# Patient Record
Sex: Male | Born: 1975 | Race: White | Hispanic: No | Smoking: Current every day smoker
Health system: Southern US, Community
[De-identification: ages and names within clinical notes are randomized; demographics above are authoritative.]

## PROBLEM LIST (undated history)

## (undated) DIAGNOSIS — E785 Hyperlipidemia, unspecified: Secondary | ICD-10-CM

## (undated) DIAGNOSIS — F524 Premature ejaculation: Secondary | ICD-10-CM

## (undated) DIAGNOSIS — Z720 Tobacco use: Secondary | ICD-10-CM

## (undated) DIAGNOSIS — G4733 Obstructive sleep apnea (adult) (pediatric): Secondary | ICD-10-CM

## (undated) DIAGNOSIS — K219 Gastro-esophageal reflux disease without esophagitis: Secondary | ICD-10-CM

## (undated) DIAGNOSIS — E669 Obesity, unspecified: Secondary | ICD-10-CM

## (undated) HISTORY — DX: Hyperlipidemia, unspecified: E78.5

## (undated) HISTORY — DX: Premature ejaculation: F52.4

## (undated) HISTORY — PX: TONSILLECTOMY: SUR1361

## (undated) HISTORY — DX: Obstructive sleep apnea (adult) (pediatric): G47.33

## (undated) HISTORY — DX: Tobacco use: Z72.0

## (undated) HISTORY — DX: Obesity, unspecified: E66.9

## (undated) HISTORY — DX: Gastro-esophageal reflux disease without esophagitis: K21.9

---

## 2008-09-19 ENCOUNTER — Ambulatory Visit: Payer: Self-pay | Admitting: Family Medicine

## 2008-09-19 DIAGNOSIS — E669 Obesity, unspecified: Secondary | ICD-10-CM

## 2008-09-19 DIAGNOSIS — F17200 Nicotine dependence, unspecified, uncomplicated: Secondary | ICD-10-CM

## 2008-10-02 ENCOUNTER — Ambulatory Visit: Payer: Self-pay | Admitting: Family Medicine

## 2008-10-02 DIAGNOSIS — R5383 Other fatigue: Secondary | ICD-10-CM

## 2008-10-02 DIAGNOSIS — R5381 Other malaise: Secondary | ICD-10-CM

## 2008-10-06 ENCOUNTER — Encounter (INDEPENDENT_AMBULATORY_CARE_PROVIDER_SITE_OTHER): Payer: Self-pay | Admitting: *Deleted

## 2008-10-06 LAB — CONVERTED CEMR LAB
BUN: 15 mg/dL (ref 6–23)
Basophils Relative: 0.4 % (ref 0.0–3.0)
Cholesterol: 218 mg/dL — ABNORMAL HIGH (ref 0–200)
Creatinine, Ser: 1.3 mg/dL (ref 0.4–1.5)
Eosinophils Relative: 6.4 % — ABNORMAL HIGH (ref 0.0–5.0)
GFR calc non Af Amer: 68.24 mL/min (ref >60)
HCT: 45 % (ref 39.0–52.0)
HDL: 31.7 mg/dL — ABNORMAL LOW (ref >39.00)
Hemoglobin: 15.9 g/dL (ref 13.0–17.0)
Lymphs Abs: 2.4 10*3/uL (ref 0.7–4.0)
MCV: 93.3 fL (ref 78.0–100.0)
Monocytes Absolute: 0.6 10*3/uL (ref 0.1–1.0)
Neutro Abs: 4.3 10*3/uL (ref 1.4–7.7)
RBC: 4.82 M/uL (ref 4.22–5.81)
Total Bilirubin: 1 mg/dL (ref 0.3–1.2)
Total CHOL/HDL Ratio: 7
VLDL: 56.8 mg/dL — ABNORMAL HIGH (ref 0.0–40.0)
WBC: 7.8 10*3/uL (ref 4.5–10.5)

## 2008-10-14 ENCOUNTER — Ambulatory Visit: Payer: Self-pay | Admitting: Cardiovascular Disease

## 2008-10-14 ENCOUNTER — Encounter: Payer: Self-pay | Admitting: Cardiovascular Disease

## 2009-02-26 ENCOUNTER — Ambulatory Visit: Payer: Self-pay | Admitting: Family Medicine

## 2009-02-26 DIAGNOSIS — J209 Acute bronchitis, unspecified: Secondary | ICD-10-CM

## 2009-03-09 ENCOUNTER — Ambulatory Visit: Payer: Self-pay | Admitting: Family Medicine

## 2009-03-09 DIAGNOSIS — R05 Cough: Secondary | ICD-10-CM

## 2009-03-09 DIAGNOSIS — R059 Cough, unspecified: Secondary | ICD-10-CM | POA: Insufficient documentation

## 2009-04-15 ENCOUNTER — Ambulatory Visit: Payer: Self-pay | Admitting: Family Medicine

## 2009-06-23 ENCOUNTER — Ambulatory Visit: Payer: Self-pay | Admitting: Family Medicine

## 2009-06-23 DIAGNOSIS — F524 Premature ejaculation: Secondary | ICD-10-CM

## 2009-10-08 ENCOUNTER — Ambulatory Visit: Payer: Self-pay | Admitting: Family Medicine

## 2009-10-08 DIAGNOSIS — L723 Sebaceous cyst: Secondary | ICD-10-CM | POA: Insufficient documentation

## 2009-10-29 ENCOUNTER — Encounter: Payer: Self-pay | Admitting: Family Medicine

## 2009-11-17 ENCOUNTER — Ambulatory Visit: Payer: Self-pay | Admitting: Family Medicine

## 2009-11-17 DIAGNOSIS — L255 Unspecified contact dermatitis due to plants, except food: Secondary | ICD-10-CM

## 2009-11-24 ENCOUNTER — Encounter: Payer: Self-pay | Admitting: Family Medicine

## 2009-11-26 ENCOUNTER — Encounter: Payer: Self-pay | Admitting: Family Medicine

## 2010-05-11 NOTE — Consult Note (Signed)
Summary: [facility]  [facility]   Imported By: Maryln Gottron 11/20/2009 14:10:48  _____________________________________________________________________  External Attachment:    Type:   Image     Comment:   External Document

## 2010-05-11 NOTE — Assessment & Plan Note (Signed)
Summary: med refill   Vital Signs:  Patient profile:   35 year old male Height:      69 inches Weight:      240.25 pounds BMI:     35.61 Temp:     97.7 degrees F oral Pulse rate:   88 / minute Pulse rhythm:   regular BP sitting:   112 / 76  (left arm) Cuff size:   large  Vitals Entered By: Delilah Shan CMA Duncan Dull) (June 23, 2009 9:46 AM) CC: Med refill   History of Present Illness: 35 yo here for med refill. Has been on Zoloft 100 mg daily for premature ejaculation since 2007. Had to make an appointment to have it refilled since he has never received this prescription from [place], only from his previous provider. Has been working well for him, no side effects.   Current Medications (verified): 1)  Zoloft 100 Mg Tabs (Sertraline Hcl) .Marland Kitchen.. 1 Daily By Mouth 2)  Zoloft 100 Mg Tabs (Sertraline Hcl) .Marland Kitchen.. 1 Tab By Mouth Dailiy  Allergies (verified): No Known Drug Allergies  Review of Systems      See HPI GU:  Denies discharge, dysuria, genital sores, hematuria, incontinence, and nocturia.  Physical Exam  General:  Well-developed,well-nourished,in no acute distress; alert,appropriate and cooperative throughout examination Abdomen:  soft, non-tender, and normal bowel sounds.   Psych:  talkative, pleasant, normal affect.   Impression & Recommendations:  Problem # 1:  PREMATURE EJACULATION (ICD-302.75) Assessment Unchanged Stable.  Continue Zoloft 100 mg daily.    Complete Medication List: 1)  Zoloft 100 Mg Tabs (Sertraline hcl) .Marland Kitchen.. 1 daily by mouth 2)  Zoloft 100 Mg Tabs (Sertraline hcl) .Marland Kitchen.. 1 tab by mouth dailiy Prescriptions: ZOLOFT 100 MG TABS (SERTRALINE HCL) 1 tab by mouth dailiy  #30 x 11   Entered and Authorized by:   Ruthe Mannan MD   Signed by:   Ruthe Mannan MD on 06/23/2009   Method used:   Electronically to        [facility]  [address] (retail)       [address]       [place], [place]  [address]       Ph: [phone] or [phone]       Fax:  [phone]   RxID:   [id number]   Current Allergies (reviewed today): No known allergies

## 2010-05-11 NOTE — Assessment & Plan Note (Signed)
Summary: POSION IVEY/RBH   Vital Signs:  Patient profile:   35 year old male Weight:      245.13 pounds Temp:     98.4 degrees F oral Pulse rate:   60 / minute Pulse rhythm:   regular BP sitting:   112 / 74  (left arm) Cuff size:   large  Vitals Entered By: Selena Batten Dance CMA Duncan Dull) (November 17, 2009 3:26 PM) CC: Poison ivy   History of Present Illness: CC: poison ivy  1 wk h/o poison ivy on right inside ankle, spreading.  Has forest out back of house.  h/o poison ivy yearly, states topical steroids don't help, neither does oral steroids.  Would like shot, says received shot in past.    Allergies: No Known Drug Allergies  Past History:  Past Medical History: Last updated: 10/14/2008 Premature ejaculation (takes Zoloft for this) Obesity Tobacco abuse Hyperlipidemia Obstructive sleep apnea GERD  Physical Exam  General:  Well-developed,well-nourished,in no acute distress; alert,appropriate and cooperative throughout examination.  vitals reviewed and stable. Skin:  pruritic linear vesicular rash starting on dorsum of right foot, going up medial side to mid-leg.  small vesicles left inner ankle.  no erythema.  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  wear long pants when out in forest.  Per pt preference, shot of steroids today, then triamcinolone ointment to use until improved.  RTC if not improving as expected.  His updated medication list for this problem includes:    Triamcinolone Acetonide 0.5 % Oint (Triamcinolone acetonide) .Marland Kitchen... Apply to affected area two times a day on ankle  Complete Medication List: 1)  Zoloft 100 Mg Tabs (Sertraline hcl) .Marland Kitchen.. 1 daily by mouth 2)  Zoloft 100 Mg Tabs (Sertraline hcl) .Marland Kitchen.. 1 tab by mouth dailiy 3)  Triamcinolone Acetonide 0.5 % Oint (Triamcinolone acetonide) .... Apply to affected area two  times a day on ankle  Patient Instructions: 1)  Poison ivy - shot of steroid today. 2)  Use steroid ointment twice daily for next week as well or until itching better. 3)  Continue with aveeno/eucerin cream for itch. 4)  Try to prevent scratching. 5)  Come back if spreading, fevers, or not improving as expected. Prescriptions: TRIAMCINOLONE ACETONIDE 0.5 % OINT (TRIAMCINOLONE ACETONIDE) apply to affected area two times a day on ankle  #1 x 0   Entered and Authorized by:   Eustaquio Boyden  MD   Signed by:   Eustaquio Boyden  MD on 11/17/2009   Method used:   [facility]        [facility]  [id number] [facility]       [address]       [place], [place]  [address]       Ph: [phone] or [phone]       Fax: [phone]   RxID:   [id number]   Current Allergies (reviewed today): No known allergies    Medication Administration  Injection # 1:    Medication: Dexamethasone Sodium Phosphate 1mg     Route: IM    Site: L deltoid    Exp Date: 05/11/2010    Lot #: [id number]    Mfr: Baxter    Comments: 5mg  given IM   Orders Added: 1)  Est. Patient Level III [34742]

## 2010-05-11 NOTE — Letter (Signed)
Summary: CMN for CPAP/[facility]  CMN for CPAP/[facility]   Imported By: Lanelle Bal 12/02/2009 08:04:23  _____________________________________________________________________  External Attachment:    Type:   Image     Comment:   External Document

## 2010-05-11 NOTE — Assessment & Plan Note (Signed)
   Vital Signs:  Patient profile:   35 year old male Height:      69 inches Weight:      238 pounds BMI:     35.27 Temp:     98.1 degrees F oral Pulse rate:   80 / minute Pulse rhythm:   regular BP sitting:   116 / 70  (left arm) Cuff size:   large  Vitals Entered By: Delilah Shan CMA Duncan Dull) (April 15, 2009 12:18 PM) CC: F/U cough   History of Present Illness: 35 yo seen approx 1 month  for dry cough lasting one month 1/2 s/p URI.   Started him on Prilosec OTC at that time as symptoms seemed consistent with GERD. No wheezing, no fevers, no sputum production, no SOB. Continues smoking.  Since last OV, cough has completely resolved.   Wants to continue Prilosec, would like a prescription for Omeprazole in case he cannot afford prilosec OTC.  Obesithy- BMI greater than 35.  Wants a "pill" for weight loss.  Knows he eats too much but he feels that won't stop unless he is helped medically.  Current Medications (verified): 1)  Zoloft 100 Mg Tabs (Sertraline Hcl) .Marland Kitchen.. 1 Daily By Mouth 2)  Omeprazole 20 Mg Cpdr (Omeprazole) .... One By Mouth Daily  Allergies (verified): No Known Drug Allergies  Review of Systems      See HPI General:  Denies chills and fever. Resp:  Denies cough.  Physical Exam  General:  Well-developed,well-nourished,in no acute distress; alert,appropriate and cooperative throughout examination Mouth:  Oral mucosa and oropharynx without lesions or exudates.  Teeth in good repair. Lungs:  Normal respiratory effort, chest expands symmetrically. Lungs are clear to auscultation, no crackles or wheezes. Heart:  Normal rate and regular rhythm. S1 and S2 normal without gallop, murmur, click, rub or other extra sounds. Psych:  talkative, pleasant, normal affect.   Impression & Recommendations:  Problem # 1:  COUGH (ICD-786.2) Assessment Improved Improved with trial of PPI.  Continue as appears to be related to GERD.  Problem # 2:  OBESITY  (ICD-278.00) Assessment: Deteriorated Time spent with patient 25 minutes, more than 50% of this time was spent counseling patient on dietary changes.  Recommended 24 hour food recall and inc exercise.  I do not feel comfortable prescribing appetite suppressants unless accompanied by lifestyle changes.  If he truly wants just medical intervention, we could refer him for lap band since his BMI is greater than 35.  He will think about it.  Complete Medication List: 1)  Zoloft 100 Mg Tabs (Sertraline hcl) .Marland Kitchen.. 1 daily by mouth 2)  Omeprazole 20 Mg Cpdr (Omeprazole) .... One by mouth daily Prescriptions: OMEPRAZOLE 20 MG CPDR (OMEPRAZOLE) one by mouth daily  #90 x 3   Entered and Authorized by:   Ruthe Mannan MD   Signed by:   Ruthe Mannan MD on 04/15/2009   Method used:   Print then Give to Patient   RxID:   [id number]   Current Allergies (reviewed today): No known allergies

## 2010-05-11 NOTE — Letter (Signed)
Summary: Records Dated 09-28-06 thru 06-05-08/[facility]  Records Dated 09-28-06 thru 06-05-08/[facility]   Imported By: Lanelle Bal 12/10/2009 12:50:17  _____________________________________________________________________  External Attachment:    Type:   Image     Comment:   External Document

## 2010-05-11 NOTE — Assessment & Plan Note (Signed)
Summary: 30 MIN. REMOVE CYST FROM BACK/CLE   Vital Signs:  Patient profile:   35 year old male Height:      69 inches Weight:      246.38 pounds BMI:     36.52 Temp:     98.6 degrees F oral Pulse rate:   80 / minute Pulse rhythm:   regular BP sitting:   122 / 80  (left arm) Cuff size:   large  Vitals Entered By: Linde Gillis CMA Duncan Dull) (October 08, 2009 2:53 PM) CC: cyst removal, 30 minutes    History of Present Illness: 35 yo here for ?cyst removal on back.  Years ago, had cyst removed, now it has returned is same spot. At times, becomes very large, red, rarely painful. Is not currently bothering him at all. He would lke to know if it needs to be removed.  Current Medications (verified): 1)  Zoloft 100 Mg Tabs (Sertraline Hcl) .Marland Kitchen.. 1 Daily By Mouth 2)  Zoloft 100 Mg Tabs (Sertraline Hcl) .Marland Kitchen.. 1 Tab By Mouth Dailiy  Allergies (verified): No Known Drug Allergies  Past History:  Past Medical History: Last updated: 10/14/2008 Premature ejaculation (takes Zoloft for this) Obesity Tobacco abuse Hyperlipidemia Obstructive sleep apnea GERD  Past Surgical History: Last updated: 10/14/2008 Tonsillectomy  Family History: Last updated: 10/14/2008 Very strong cardiac history MGF, CABG at 62 PGM, MI at 89 PGF, CABG x 3 at 26, died ten years later PGM, MI at 68 and died MGF, CABG at 96, died at 55 Aunt, CABG at 52 Father, prostate CA, d/c 09/15/08, ?sudden death, ?MI Uncle, father's brother died from CA Mother alive and healthy  Social History: Last updated: 10/14/2008 Married, 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 GI:  Denies  nausea and vomiting.  Physical Exam  General:  Well-developed,well-nourished,in no acute distress; alert,appropriate and cooperative throughout examination Skin:  small palpable cyst in mid lumbar (midline), prior old scar directly on top of it. Psych:  talkative, pleasant, normal affect.   Impression & Recommendations:  Problem # 1:  EPIDERMOID CYST, BACK (ICD-706.2) Assessment New Will refer to derm given that it feels very deep and there is a lot of overlying scar tissue. Pt agreed with plan. Orders: Dermatology Referral (Derma)  Complete Medication List: 1)  Zoloft 100 Mg Tabs (Sertraline hcl) .Marland Kitchen.. 1 daily by mouth 2)  Zoloft 100 Mg Tabs (Sertraline hcl) .Marland Kitchen.. 1 tab by mouth dailiy  Patient Instructions: 1)  Please stop by to see Shirlee Limerick on your way out.  Current Allergies (reviewed today): No known allergies

## 2010-05-11 NOTE — Letter (Signed)
Summary: Medical Necessity for CPAP Supplies/[facility]  Medical Necessity for CPAP Supplies/[facility]   Imported By: Maryln Gottron 11/26/2009 14:55:06  _____________________________________________________________________  External Attachment:    Type:   Image     Comment:   External Document

## 2010-09-24 ENCOUNTER — Encounter: Payer: Self-pay | Admitting: Cardiovascular Disease

## 2014-06-15 ENCOUNTER — Emergency Department: Payer: Self-pay | Admitting: Emergency Medicine

## 2016-08-10 IMAGING — CT CT ABD-PELV W/ CM
2 of 4 series · 16 of 46 positions shown, 18 images · IV contrast (omnipaque)
Comparison: None.

CLINICAL DATA: Lower abdominal and suprapubic pelvic pain for 2
days. Nausea. Diaphoresis. Diarrhea.

EXAM:
CT ABDOMEN AND PELVIS WITH CONTRAST
TECHNIQUE: Multidetector CT imaging of the abdomen and pelvis was performed
using the standard protocol following bolus administration of
intravenous contrast.
CONTRAST:  100 mL Omnipaque 350

[Series 2: routine abd pel with · axial · 0.68mm/px · z∈[-418,+27]mm · 13 of 99 slices shown, 15 images]
[im 5/99  soft-tissue]
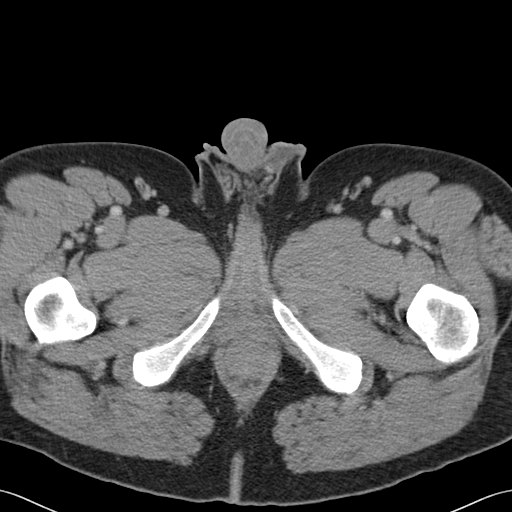
[im 5/99  bone]
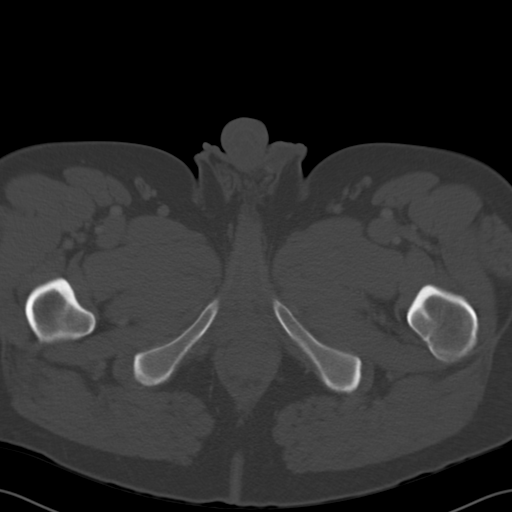
[im 13/99  soft-tissue]
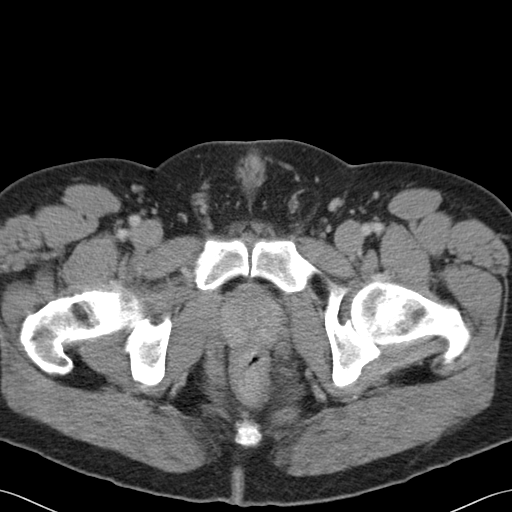
[im 21/99  soft-tissue]
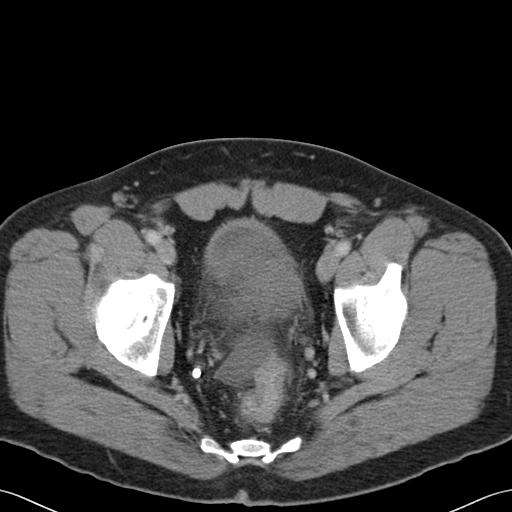
[im 29/99  soft-tissue]
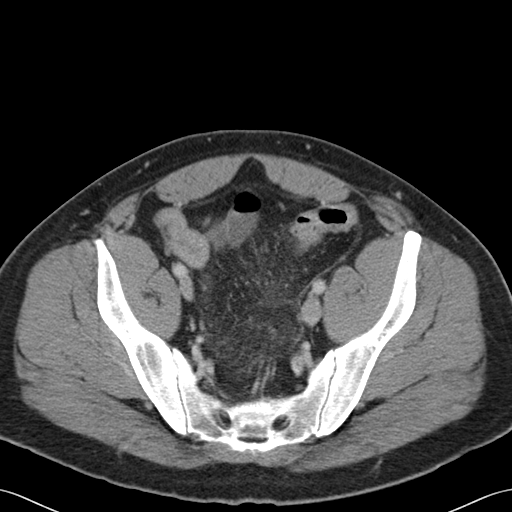
[im 33/99  soft-tissue]
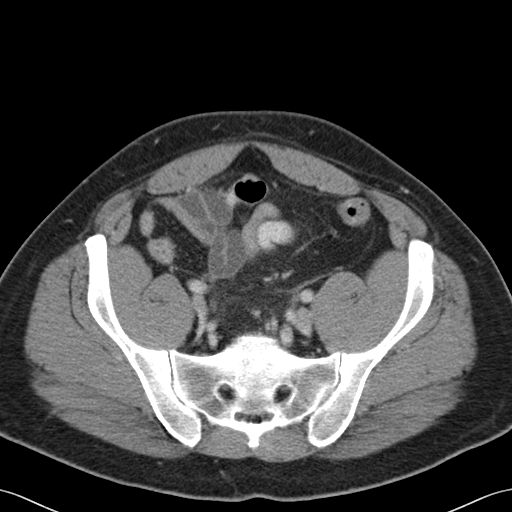
[im 41/99  soft-tissue]
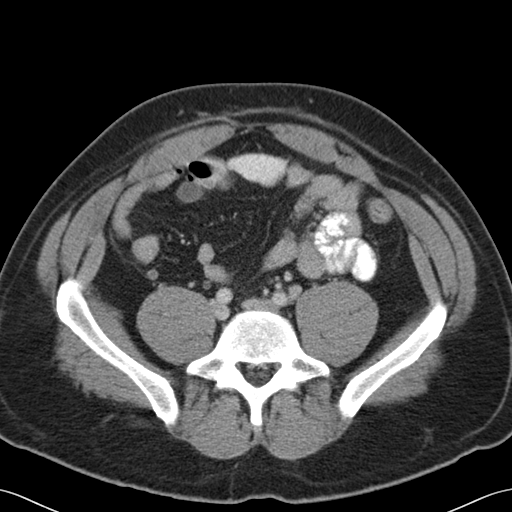
[im 50/99  soft-tissue]
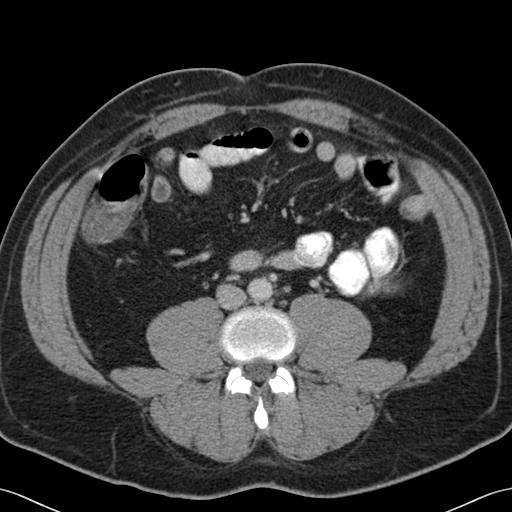
[im 58/99  soft-tissue]
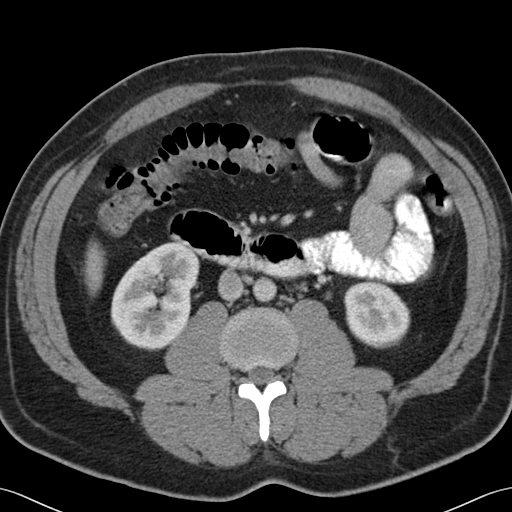
[im 66/99  soft-tissue]
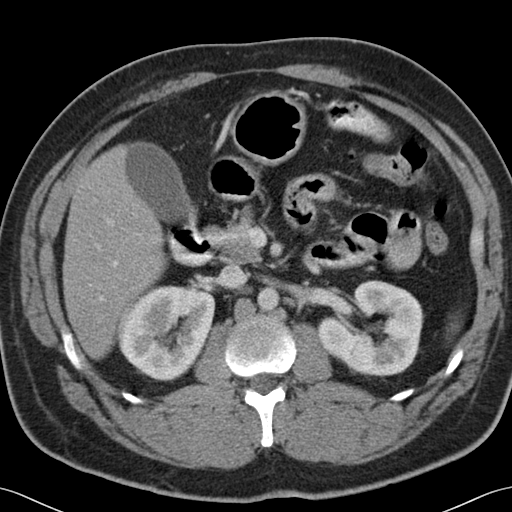
[im 66/99  bone]
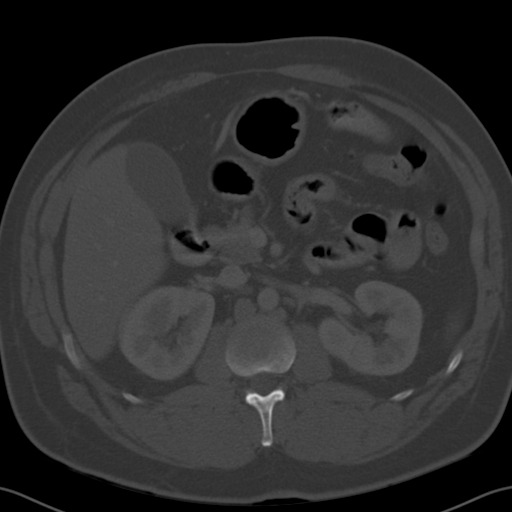
[im 70/99  soft-tissue]
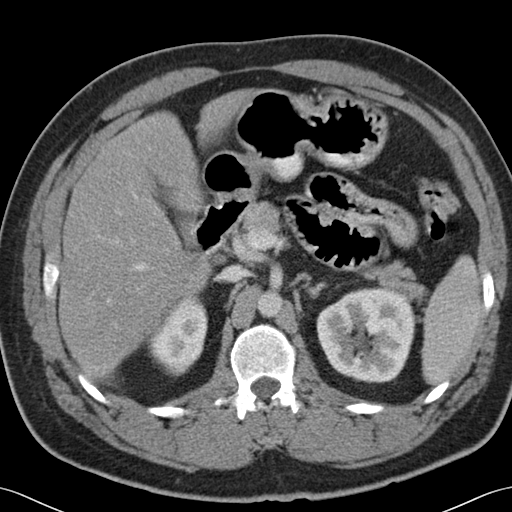
[im 78/99  soft-tissue]
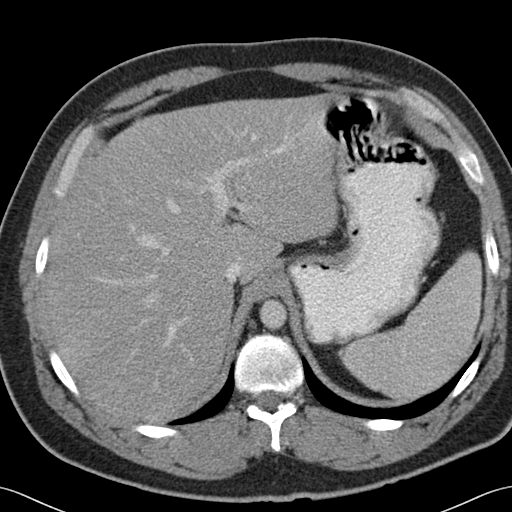
[im 86/99  soft-tissue]
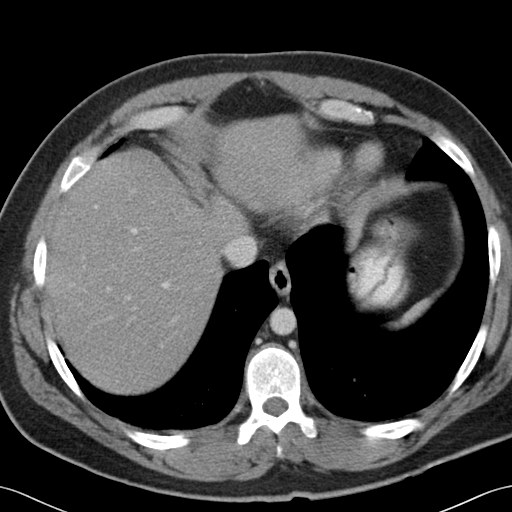
[im 94/99  soft-tissue]
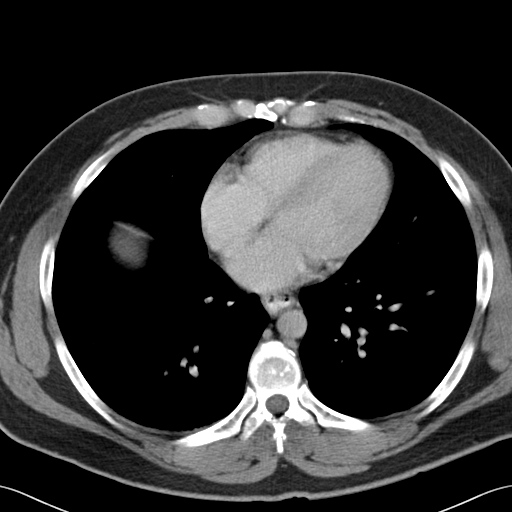

[Series 5: cor routine abd pel with · coronal · 0.69mm/px · 3 of 153 slices shown]
[im 51/153  soft-tissue]
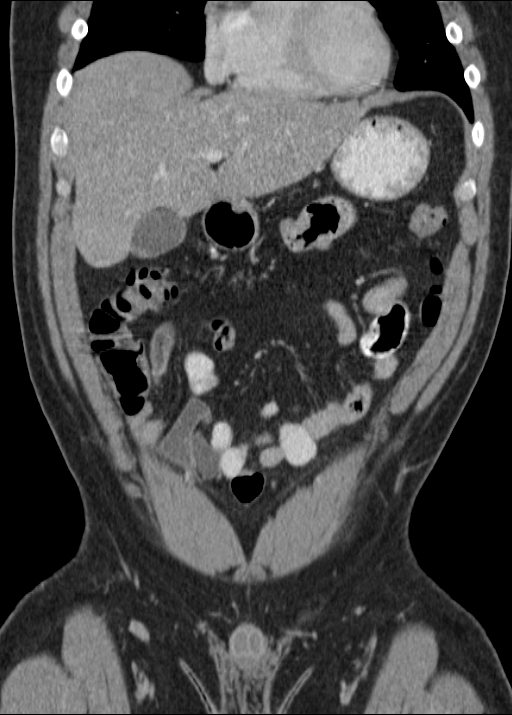
[im 68/153  soft-tissue]
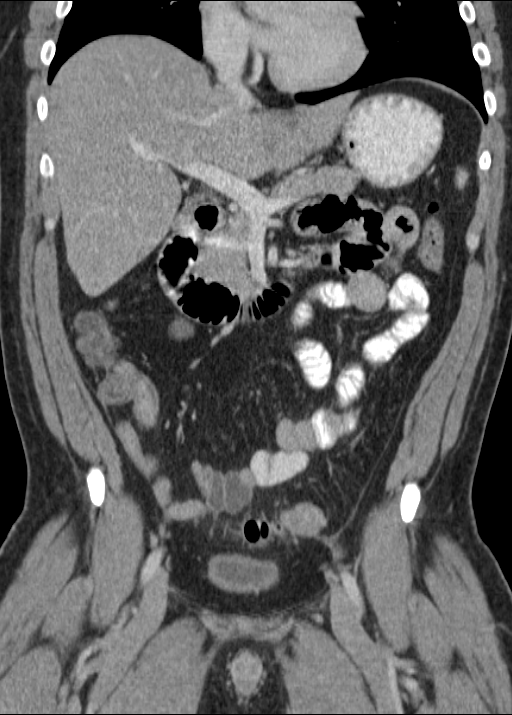
[im 85/153  soft-tissue]
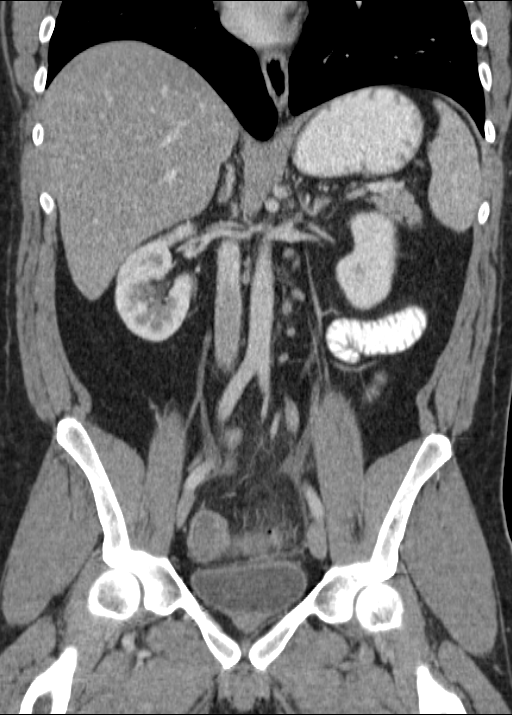

[16 of 46 positions shown; findings below may reference images not displayed]

FINDINGS: Lower Chest:  Unremarkable.

Hepatobiliary: Mild diffuse hepatic steatosis noted. No liver masses
identified. Gallbladder is unremarkable.

Pancreas: No mass, inflammatory changes, or other significant
abnormality identified.

Spleen:  Within normal limits in size and appearance.

Adrenals:  No masses identified.

Kidneys/Urinary Tract:  No evidence of masses or hydronephrosis.

Stomach/Bowel/Peritoneum: Moderate sigmoid diverticulitis is seen.
Small amount of free fluid seen in the pelvis, but no abscess
identified. No evidence of bowel obstruction. Normal appendix
visualized.

Vascular/Lymphatic: No pathologically enlarged lymph nodes
identified. No other significant abnormality visualized.

Reproductive:  No mass or other significant abnormality identified.

Other:  None.

Musculoskeletal:  No suspicious bone lesions identified.
IMPRESSION: Moderate sigmoid diverticulitis. Small amount pelvic free fluid
noted, but no abscess identified.

Mild hepatic steatosis incidentally noted.

## 2016-09-16 ENCOUNTER — Encounter: Payer: Self-pay | Admitting: Emergency Medicine

## 2016-09-16 ENCOUNTER — Ambulatory Visit
Admission: EM | Admit: 2016-09-16 | Discharge: 2016-09-16 | Disposition: A | Discharge status: Home / Self Care | Payer: BLUE CROSS/BLUE SHIELD | Attending: Family Medicine | Admitting: Family Medicine

## 2016-09-16 DIAGNOSIS — L03115 Cellulitis of right lower limb: Secondary | ICD-10-CM | POA: Diagnosis not present

## 2016-09-16 DIAGNOSIS — W57XXXA Bitten or stung by nonvenomous insect and other nonvenomous arthropods, initial encounter: Secondary | ICD-10-CM

## 2016-09-16 DIAGNOSIS — R21 Rash and other nonspecific skin eruption: Secondary | ICD-10-CM | POA: Diagnosis not present

## 2016-09-16 DIAGNOSIS — S70261A Insect bite (nonvenomous), right hip, initial encounter: Secondary | ICD-10-CM | POA: Diagnosis not present

## 2016-09-16 MED ORDER — MUPIROCIN 2 % EX OINT: 1.0000 "application " | TOPICAL_OINTMENT | Freq: Three times a day (TID) | CUTANEOUS | 0 refills | Status: AC

## 2016-09-16 NOTE — ED Triage Notes (Signed)
Patient reports tick bite about 17 days ago.  Patient reports some redness at the site.  Patient denies fevers or bodyaches.

## 2016-09-16 NOTE — ED Provider Notes (Signed)
CSN: [id number]     Arrival date & time 09/16/16  1401 History   First MD Initiated Contact with Patient 09/16/16 1507     Chief Complaint  Patient presents with  . Insect Bite   (Consider location/radiation/quality/duration/timing/severity/associated sxs/prior Treatment) HPI  This a [age] year old male who had a "tick bite"approximately 17 days ago. The tick was not engorged and not firmly attached to his skin. He is able to pull off without any problem. Shortly afterwards he started noticing a rash develop and was very itchy. He has been scratching it. There are excoriations present. Not long after that tick bite he found another tick had bitten him just proximal to the first on his right hip and that tic also was not engorged but it was more firmly attached. Both ticks were described as being very tiny. He denies any fever or chills or rash other than what is on his right hip. He became concerned because the area on his hip seems to be spreading somewhat. He is afebrile today.        Past Medical History:  Diagnosis Date  . GERD (gastroesophageal reflux disease)   . Hyperlipidemia   . Obesity   . Obstructive sleep apnea   . Premature ejaculation   . Tobacco abuse    Past Surgical History:  Procedure Laterality Date  . TONSILLECTOMY     Family History  Problem Relation Age of Onset  . Healthy Father   . Heart attack Paternal Grandmother [id number]       deceased  . Healthy Mother    Social History  Substance Use Topics  . Smoking status: Current Every Day Smoker  . Smokeless tobacco: Never Used     Comment: 3/4 ppd for 14 years   . Alcohol use Yes     Comment: social     Review of Systems  Constitutional: Negative for activity change, appetite change, chills, fatigue and fever.  Skin: Positive for rash.  All other systems reviewed and are negative.   Allergies  Patient has no known allergies.  Home Medications   Prior to Admission medications   Medication Sig Start  Date End Date Taking? Authorizing Provider  FENOFIBRATE PO Take by mouth.   Yes [provider]  pravastatin (PRAVACHOL) 20 MG tablet Take 20 mg by mouth daily.   Yes [provider]  mupirocin ointment (BACTROBAN) 2 % Apply 1 application topically 3 (three) times daily. 09/16/16   Lutricia Feiloemer, Yaron Grasse P, PA-C   Meds Ordered and Administered this Visit  Medications - No data to display  BP 125/81 (BP Location: Left Arm)   Pulse 67   Temp 98.1 F (36.7 C) (Oral)   Resp 16   Ht 5\' 8"  (1.727 m)   Wt 212 lb (96.2 kg)   SpO2 99%   BMI 32.23 kg/m  No data found.   Physical Exam  Constitutional: He is oriented to person, place, and time. He appears well-developed and well-nourished. No distress.  HENT:  Head: Normocephalic.  Eyes: Pupils are equal, round, and reactive to light.  Neck: Normal range of motion.  Musculoskeletal: Normal range of motion.  Neurological: He is alert and oriented to person, place, and time.  Skin: Skin is warm and dry. Rash noted. He is not diaphoretic.  Examination of the right anterior hip shows a patch approximately 4 cm x 5 cm with excoriations erythema and papules that do not have a purulence or vesicles present. Is blanchable. In the center is  a small area of a bite mark and also one at the very periphery over the proximal most portion. He has no other rashes seen on his trunk or on his legs or arms.  Psychiatric: He has a normal mood and affect. His behavior is normal. Judgment and thought content normal.  Nursing note and vitals reviewed.   Urgent Care Course     Procedures (including critical care time)  Labs Review Labs Reviewed - No data to display  Imaging Review No results found.   Visual Acuity Review  Right Eye Distance:   Left Eye Distance:   Bilateral Distance:    Right Eye Near:   Left Eye Near:    Bilateral Near:         MDM   1. Cellulitis of leg, right   2. Rash   3. Tick bite, initial encounter     Discharge Medication List as of 09/16/2016  3:18 PM    START taking these medications   Details  mupirocin ointment (BACTROBAN) 2 % Apply 1 application topically 3 (three) times daily., Starting Fri 09/16/2016, Normal      Plan: 1. Test/x-ray results and diagnosis reviewed with patient 2. rx as per orders; risks, benefits, potential side effects reviewed with patient 3. Recommend supportive treatment with Voiding itching is much as possible. Apply Bactroban ointment to the entire area 3 times daily. If it is not improving within a week he should return to our clinic. The cellulitis is very superficial and should respond to the Bactroban ointment. 4. F/u prn if symptoms worsen or don't improve     Lutricia Feil, PA-C 09/16/16 1609

## 2021-09-21 ENCOUNTER — Emergency Department
Admission: EM | Admit: 2021-09-21 | Discharge: 2021-09-21 | Disposition: A | Discharge status: Home / Self Care | Payer: BC Managed Care – PPO | Attending: Emergency Medicine | Admitting: Emergency Medicine

## 2021-09-21 ENCOUNTER — Other Ambulatory Visit: Payer: Self-pay

## 2021-09-21 DIAGNOSIS — L02419 Cutaneous abscess of limb, unspecified: Secondary | ICD-10-CM

## 2021-09-21 DIAGNOSIS — L02412 Cutaneous abscess of left axilla: Secondary | ICD-10-CM | POA: Diagnosis present

## 2021-09-21 LAB — BASIC METABOLIC PANEL
Anion gap: 10 (ref 5–15)
BUN: 11 mg/dL (ref 6–20)
CO2: 23 mmol/L (ref 22–32)
Calcium: 9.1 mg/dL (ref 8.9–10.3)
Chloride: 101 mmol/L (ref 98–111)
Creatinine, Ser: 0.99 mg/dL (ref 0.61–1.24)
GFR, Estimated: >60 mL/min (ref >60)
Glucose, Bld: 97 mg/dL (ref 70–99)
Potassium: 4.1 mmol/L (ref 3.5–5.1)
Sodium: 134 mmol/L — ABNORMAL LOW (ref 135–145)

## 2021-09-21 LAB — CBC
HCT: 47.1 % (ref 39.0–52.0)
Hemoglobin: 16.5 g/dL (ref 13.0–17.0)
MCH: 35.6 pg — ABNORMAL HIGH (ref 26.0–34.0)
MCHC: 35 g/dL (ref 30.0–36.0)
MCV: 101.5 fL — ABNORMAL HIGH (ref 80.0–100.0)
Platelets: 207 10*3/uL (ref 150–400)
RBC: 4.64 MIL/uL (ref 4.22–5.81)
RDW: 12.4 % (ref 11.5–15.5)
WBC: 7.7 10*3/uL (ref 4.0–10.5)
nRBC: 0 % (ref 0.0–0.2)

## 2021-09-21 MED ORDER — DOXYCYCLINE HYCLATE 100 MG PO CAPS: 100.0000 mg | ORAL_CAPSULE | Freq: Two times a day (BID) | ORAL | 0 refills | Status: AC

## 2021-09-21 MED ORDER — CEFDINIR 300 MG PO CAPS
300.0000 mg | ORAL_CAPSULE | Freq: Once | ORAL | Status: AC
Start: 2021-09-21 — End: 2021-09-21
  Administered 2021-09-21: 300 mg via ORAL
  Filled 2021-09-21: qty 1

## 2021-09-21 MED ORDER — IBUPROFEN 600 MG PO TABS: 600.0000 mg | ORAL_TABLET | Freq: Three times a day (TID) | ORAL | 0 refills | Status: AC | PRN

## 2021-09-21 MED ORDER — DOXYCYCLINE HYCLATE 100 MG PO TABS
100.0000 mg | ORAL_TABLET | Freq: Once | ORAL | Status: AC
  Administered 2021-09-21: 100 mg via ORAL
  Filled 2021-09-21: qty 1

## 2021-09-21 MED ORDER — CEFDINIR 300 MG PO CAPS: 300.0000 mg | ORAL_CAPSULE | Freq: Two times a day (BID) | ORAL | 0 refills | Status: AC

## 2021-09-21 MED ORDER — LIDOCAINE-EPINEPHRINE 2 %-1:100000 IJ SOLN
20.0000 mL | Freq: Once | INTRAMUSCULAR | Status: AC
  Administered 2021-09-21: 20 mL via INTRADERMAL
  Filled 2021-09-21: qty 1

## 2021-09-21 NOTE — ED Provider Notes (Signed)
[facility] Provider Note    Event Date/Time   First MD Initiated Contact with Patient 09/21/21 1500     (approximate)   History   Abscess   HPI  Justin Cabrera is a 46 y.o. male  here with L axillary pain, redness. Pt reports for the past week he's had progressively worsening swelling, pain of his left axillae around a hair follicle. While in the waiting room, he noticed it began to drain foul-smelling purulent material. Reports it has been increasingly painful, limiting his ability to rest his arm down due to the pain. He has had some subjective fevers, chills. No n/v. No diabetes or immunosuppression. He has had "spots" in his axillae before but has not required drainage. He has had a sebaceous cyst drained in the past.        Physical Exam   Triage Vital Signs: ED Triage Vitals  Enc Vitals Group     BP 09/21/21 1419 (!) 131/93     Pulse Rate 09/21/21 1419 83     Resp 09/21/21 1419 16     Temp 09/21/21 1419 98.6 F (37 C)     Temp Source 09/21/21 1419 Oral     SpO2 09/21/21 1419 97 %     Weight 09/21/21 1439 212 lb 1.3 oz (96.2 kg)     Height 09/21/21 1439 5\' 8"  (1.727 m)     Head Circumference --      Peak Flow --      Pain Score 09/21/21 1348 10     Pain Loc --      Pain Edu? --      Excl. in GC? --     Most recent vital signs: Vitals:   09/21/21 1419  BP: (!) 131/93  Pulse: 83  Resp: 16  Temp: 98.6 F (37 C)  SpO2: 97%     General: Awake, no distress.  CV:  Good peripheral perfusion. Regular rate and rhythm.  Resp:  Normal effort. Lungs clear bilaterally. Abd:  No distention. No tenderness. Other:  Left axilla with approx 2 x 3 cm area of induration and mild fluctuance surrounding hair follicle, draining foul-smelling purulent discharge. No surrounding erythema, fluctuance, or crepitance. Non-toxic appearing.   ED Results / Procedures / Treatments   Labs (all labs ordered are listed, but only abnormal results are  displayed) Labs Reviewed  CBC - Abnormal; Notable for the following components:      Result Value   MCV 101.5 (*)    MCH 35.6 (*)    All other components within normal limits  BASIC METABOLIC PANEL - Abnormal; Notable for the following components:   Sodium 134 (*)    All other components within normal limits  AEROBIC/ANAEROBIC CULTURE W GRAM STAIN (SURGICAL/DEEP WOUND)     EKG None   RADIOLOGY None   I also independently reviewed and agree with radiologist interpretations.   PROCEDURES:  Critical Care performed: No  ..Incision and Drainage  Date/Time: 09/21/2021 3:11 PM  Performed by: Shaune PollackIsaacs, Hafiz Irion, MD Authorized by: Shaune PollackIsaacs, Quinne Pires, MD   Consent:    Consent obtained:  Verbal   Consent given by:  Patient   Risks discussed:  Bleeding, damage to other organs, incomplete drainage, infection and pain   Alternatives discussed:  Alternative treatment and delayed treatment Universal protocol:    Procedure explained and questions answered to patient or proxy's satisfaction: no     Relevant documents present and verified: no     Test results  available : no     Imaging studies available: no     Required blood products, implants, devices, and special equipment available: no     Immediately prior to procedure, a time out was called: yes     Patient identity confirmed:  Verbally with patient Location:    Type:  Abscess   Size:  3 x 2 cm   Location: Left axilla. Pre-procedure details:    Skin preparation:  Betadine Anesthesia:    Anesthesia method:  Local infiltration   Local anesthetic:  Lidocaine 2% WITH epi Procedure type:    Complexity:  Simple Procedure details:    Ultrasound guidance: no     Needle aspiration: no     Incision types:  Single straight   Incision depth:  Dermal   Wound management:  Probed and deloculated and irrigated with saline   Drainage:  Purulent   Drainage amount:  Copious   Packing materials:  1/4 in iodoform gauze Post-procedure  details:    Procedure completion:  Tolerated well, no immediate complications     MEDICATIONS ORDERED IN ED: Medications  doxycycline (VIBRA-TABS) tablet 100 mg (has no administration in time range)  cefdinir (OMNICEF) capsule 300 mg (has no administration in time range)  lidocaine-EPINEPHrine (XYLOCAINE W/EPI) 2 %-1:100000 (with pres) injection 20 mL (20 mLs Intradermal Given 09/21/21 1521)     IMPRESSION / MDM / ASSESSMENT AND PLAN / ED COURSE  I reviewed the triage vital signs and the nursing notes.                               The patient is on the cardiac monitor to evaluate for evidence of arrhythmia and/or significant heart rate changes.   Ddx:  Differential includes the following, with pertinent life- or limb-threatening emergencies considered:  Abscess, focal cellulitis, folliculitis, infected sebaceous cyst, infected/necrotic lymphadenopathy  Patient's presentation is most consistent with acute illness / injury with system symptoms.  MDM:  46 yo M here with left axillary pain, swelling. Exam is c/w focal folliculitis w/ abscess versus infected sebaceous cyst. I&D performed, pt tolerated well. Wound cultures sent. Pt has no fever, tachycardia, leukocytosis, or signs of systemic illness or sepsis. He is not diabetic or immunosuppressed. Will place on broad spectrum coverage given that this did occur likely after jetskiing in salt and freshwater, and d/c with outpt follow-up. Packing placed - will remove in 48 hr. No signs of nec fasc or systemic infection. Cefdinir/doxy given for broad coverage including Vibrio.   MEDICATIONS GIVEN IN ED: Medications  doxycycline (VIBRA-TABS) tablet 100 mg (has no administration in time range)  cefdinir (OMNICEF) capsule 300 mg (has no administration in time range)  lidocaine-EPINEPHrine (XYLOCAINE W/EPI) 2 %-1:100000 (with pres) injection 20 mL (20 mLs Intradermal Given 09/21/21 1521)     Consults:     EMR reviewed        FINAL CLINICAL IMPRESSION(S) / ED DIAGNOSES   Final diagnoses:  Abscess, axilla     Rx / DC Orders   ED Discharge Orders          Ordered    cefdinir (OMNICEF) 300 MG capsule  2 times daily        09/21/21 1541    doxycycline (VIBRAMYCIN) 100 MG capsule  2 times daily        09/21/21 1541    ibuprofen (ADVIL) 600 MG tablet  Every 8 hours PRN  09/21/21 1541             Note:  This document was prepared using Dragon voice recognition software and may include unintentional dictation errors.   Shaune Pollack, MD 09/21/21 [phone]

## 2021-09-21 NOTE — Discharge Instructions (Signed)
Remove the packing in 48 hours  You can continue warm compresses to the area several times daily until healed  It is OK to take showers, do not swim or submerge the wound underwater until healed  Return to the ER with any worsening symptoms  Take the antibiotics as prescribed

## 2021-09-21 NOTE — ED Triage Notes (Addendum)
Pt comes with c/o abscess under left arm, body ache and chills.  Pt states this started week ago.

## 2021-09-26 LAB — AEROBIC/ANAEROBIC CULTURE W GRAM STAIN (SURGICAL/DEEP WOUND)
Culture: NORMAL
Special Requests: NORMAL

## 2022-06-17 ENCOUNTER — Encounter: Payer: Self-pay | Admitting: *Deleted

## 2022-06-20 ENCOUNTER — Encounter
Admission: RE | Disposition: A | Discharge status: Home / Self Care | Payer: Self-pay | Source: Home / Self Care | Attending: Gastroenterology

## 2022-06-20 ENCOUNTER — Ambulatory Visit: Payer: BC Managed Care – PPO | Admitting: Anesthesiology

## 2022-06-20 ENCOUNTER — Ambulatory Visit
Admission: RE | Admit: 2022-06-20 | Discharge: 2022-06-20 | Disposition: A | Discharge status: Home / Self Care | Payer: BC Managed Care – PPO | Attending: Gastroenterology | Admitting: Gastroenterology

## 2022-06-20 DIAGNOSIS — E785 Hyperlipidemia, unspecified: Secondary | ICD-10-CM | POA: Insufficient documentation

## 2022-06-20 DIAGNOSIS — K5730 Diverticulosis of large intestine without perforation or abscess without bleeding: Secondary | ICD-10-CM | POA: Insufficient documentation

## 2022-06-20 DIAGNOSIS — Z6836 Body mass index (BMI) 36.0-36.9, adult: Secondary | ICD-10-CM | POA: Insufficient documentation

## 2022-06-20 DIAGNOSIS — K640 First degree hemorrhoids: Secondary | ICD-10-CM | POA: Insufficient documentation

## 2022-06-20 DIAGNOSIS — D125 Benign neoplasm of sigmoid colon: Secondary | ICD-10-CM | POA: Insufficient documentation

## 2022-06-20 DIAGNOSIS — Z800 Family history of malignant neoplasm of digestive organs: Secondary | ICD-10-CM | POA: Diagnosis not present

## 2022-06-20 DIAGNOSIS — Z1211 Encounter for screening for malignant neoplasm of colon: Secondary | ICD-10-CM | POA: Insufficient documentation

## 2022-06-20 DIAGNOSIS — G4733 Obstructive sleep apnea (adult) (pediatric): Secondary | ICD-10-CM | POA: Insufficient documentation

## 2022-06-20 DIAGNOSIS — E669 Obesity, unspecified: Secondary | ICD-10-CM | POA: Insufficient documentation

## 2022-06-20 DIAGNOSIS — F17200 Nicotine dependence, unspecified, uncomplicated: Secondary | ICD-10-CM | POA: Diagnosis not present

## 2022-06-20 DIAGNOSIS — J449 Chronic obstructive pulmonary disease, unspecified: Secondary | ICD-10-CM | POA: Insufficient documentation

## 2022-06-20 HISTORY — PX: COLONOSCOPY WITH PROPOFOL: SHX5780

## 2022-06-20 SURGERY — COLONOSCOPY WITH PROPOFOL
Anesthesia: General

## 2022-06-20 MED ORDER — PROPOFOL 10 MG/ML IV BOLUS
INTRAVENOUS | Status: DC | PRN
  Administered 2022-06-20: 70 mg via INTRAVENOUS

## 2022-06-20 MED ORDER — LIDOCAINE HCL (CARDIAC) PF 100 MG/5ML IV SOSY
PREFILLED_SYRINGE | INTRAVENOUS | Status: DC | PRN
  Administered 2022-06-20: 50 mg via INTRAVENOUS

## 2022-06-20 MED ORDER — PROPOFOL 500 MG/50ML IV EMUL
INTRAVENOUS | Status: DC | PRN
  Administered 2022-06-20: 140 ug/kg/min via INTRAVENOUS

## 2022-06-20 MED ORDER — SODIUM CHLORIDE 0.9 % IV SOLN
INTRAVENOUS | Status: DC
  Administered 2022-06-20: 20 mL/h via INTRAVENOUS

## 2022-06-20 NOTE — Anesthesia Preprocedure Evaluation (Signed)
Anesthesia Evaluation  Patient identified by MRN, date of birth, ID band Patient awake    Reviewed: Allergy & Precautions, NPO status , Patient's Chart, lab work & pertinent test results  Airway Mallampati: II  TM Distance: >3 FB Neck ROM: Full    Dental  (+) Teeth Intact   Pulmonary neg pulmonary ROS, sleep apnea , COPD, Current Smoker and Patient abstained from smoking.   Pulmonary exam normal  + decreased breath sounds      Cardiovascular negative cardio ROS Normal cardiovascular exam Rhythm:Regular Rate:Normal     Neuro/Psych negative neurological ROS  negative psych ROS   GI/Hepatic negative GI ROS, Neg liver ROS,GERD  ,,  Endo/Other  negative endocrine ROS    Renal/GU negative Renal ROS  negative genitourinary   Musculoskeletal   Abdominal  (+) + obese  Peds  Hematology negative hematology ROS (+)   Anesthesia Other Findings Past Medical History: No date: GERD (gastroesophageal reflux disease) No date: Hyperlipidemia No date: Obesity No date: Obstructive sleep apnea No date: Premature ejaculation No date: Tobacco abuse  Past Surgical History: No date: TONSILLECTOMY  BMI    Body Mass Index: 36.45 kg/m      Reproductive/Obstetrics negative OB ROS                             Anesthesia Physical Anesthesia Plan  ASA: 3  Anesthesia Plan: General   Post-op Pain Management:    Induction: Intravenous  PONV Risk Score and Plan: Propofol infusion and TIVA  Airway Management Planned: Natural Airway  Additional Equipment:   Intra-op Plan:   Post-operative Plan:   Informed Consent: I have reviewed the patients History and Physical, chart, labs and discussed the procedure including the risks, benefits and alternatives for the proposed anesthesia with the patient or authorized representative who has indicated his/her understanding and acceptance.     Dental Advisory  Given  Plan Discussed with: CRNA and Surgeon  Anesthesia Plan Comments:        Anesthesia Quick Evaluation

## 2022-06-20 NOTE — Anesthesia Postprocedure Evaluation (Signed)
Anesthesia Post Note  Patient: Justin Cabrera  Procedure(s) Performed: COLONOSCOPY WITH PROPOFOL  Patient location during evaluation: PACU Anesthesia Type: General Level of consciousness: awake and oriented Pain management: satisfactory to patient Vital Signs Assessment: post-procedure vital signs reviewed and stable Respiratory status: spontaneous breathing and nonlabored ventilation Cardiovascular status: stable Anesthetic complications: no   No notable events documented.   Last Vitals:  Vitals:   06/20/22 1237 06/20/22 1248  BP: (!) 140/97 (!) 134/90  Pulse: 93 89  Resp:  18  Temp: 36.6 C   SpO2: 97% 99%    Last Pain:  Vitals:   06/20/22 1248  TempSrc:   PainSc: 0-No pain                 VAN STAVEREN,Yaser Harvill

## 2022-06-20 NOTE — Transfer of Care (Signed)
Immediate Anesthesia Transfer of Care Note  Patient: Justin Cabrera  Procedure(s) Performed: COLONOSCOPY WITH PROPOFOL  Patient Location: PACU  Anesthesia Type:General  Level of Consciousness: awake, alert , and oriented  Airway & Oxygen Therapy: Patient Spontanous Breathing  Post-op Assessment: Report given to RN and Post -op Vital signs reviewed and stable  Post vital signs: Reviewed and stable  Last Vitals:  Vitals Value Taken Time  BP    Temp    Pulse 92 06/20/22 1238  Resp 26 06/20/22 1238  SpO2 96 % 06/20/22 1238  Vitals shown include unvalidated device data.  Last Pain:  Vitals:   06/20/22 1105  TempSrc: Temporal  PainSc: 0-No pain         Complications: No notable events documented.

## 2022-06-20 NOTE — H&P (Signed)
Outpatient short stay form Pre-procedure 06/20/2022  [name], MD  Primary Physician: Owens Loffler, MD  Reason for visit:  Screening  History of present illness:    47 y/o gentleman with history of HLD and family history of rectal cancer in his mother who was younger than 27 when diagnosed. No blood thinners. No abdominal surgeries.    Current Facility-Administered Medications:    0.9 %  sodium chloride infusion, , Intravenous, Continuous, Braylen Staller, Hilton Cork, MD, Last Rate: 20 mL/hr at 06/20/22 1115, 20 mL/hr at 06/20/22 1115  Medications Prior to Admission  Medication Sig Dispense Refill Last Dose   FENOFIBRATE PO Take by mouth.   Past Week   ibuprofen (ADVIL) 600 MG tablet Take 1 tablet (600 mg total) by mouth every 8 (eight) hours as needed for moderate pain. 20 tablet 0 Past Week   mupirocin ointment (BACTROBAN) 2 % Apply 1 application topically 3 (three) times daily. 22 g 0 Past Week   pravastatin (PRAVACHOL) 20 MG tablet Take 20 mg by mouth daily.   Past Week     No Known Allergies   Past Medical History:  Diagnosis Date   GERD (gastroesophageal reflux disease)    Hyperlipidemia    Obesity    Obstructive sleep apnea    Premature ejaculation    Tobacco abuse     Review of systems:  Otherwise negative.    Physical Exam  Gen: Alert, oriented. Appears stated age.  HEENT:PERRLA. Lungs: No respiratory distress CV: RRR Abd: soft, benign, no masses Ext: No edema    Planned procedures: Proceed with colonoscopy. The patient understands the nature of the planned procedure, indications, risks, alternatives and potential complications including but not limited to bleeding, infection, perforation, damage to internal organs and possible oversedation/side effects from anesthesia. The patient agrees and gives consent to proceed.  Please refer to procedure notes for findings, recommendations and patient disposition/instructions.     [name],  MD [facility]

## 2022-06-20 NOTE — Op Note (Signed)
[facility] Patient Name: Justin Cabrera Procedure Date: 06/20/2022 11:14 AM MRN: [id number] Account #: [id number] Date of Birth: 09/17/1975 Admit Type: Outpatient Age: 47 Room: [facility] ENDO ROOM 3 Gender: Male Note Status: Finalized Instrument Name: Jasper Riling [id number] Procedure:             Colonoscopy Indications:           Screening in patient at increased risk: Family history                         of 1st-degree relative with colorectal cancer before                         age 1 years Providers:             Andrey Farmer MD, MD Referring MD:          Maud Deed. Copland MD, MD (Referring MD) Medicines:             Monitored Anesthesia Care Complications:         No immediate complications. Procedure:             Pre-Anesthesia Assessment:                        - Prior to the procedure, a History and Physical was                         performed, and patient medications and allergies were                         reviewed. The patient is competent. The risks and                         benefits of the procedure and the sedation options and                         risks were discussed with the patient. All questions                         were answered and informed consent was obtained.                         Patient identification and proposed procedure were                         verified by the physician, the nurse, the                         anesthesiologist, the anesthetist and the technician                         in the endoscopy suite. Mental Status Examination:                         alert and oriented. Airway Examination: normal                         oropharyngeal airway and neck mobility. Respiratory  Examination: clear to auscultation. CV Examination:                         normal. Prophylactic Antibiotics: The patient does not                         require prophylactic antibiotics. Prior                          Anticoagulants: The patient has taken no anticoagulant                         or antiplatelet agents. ASA Grade Assessment: III - A                         patient with severe systemic disease. After reviewing                         the risks and benefits, the patient was deemed in                         satisfactory condition to undergo the procedure. The                         anesthesia plan was to use monitored anesthesia care                         (MAC). Immediately prior to administration of                         medications, the patient was re-assessed for adequacy                         to receive sedatives. The heart rate, respiratory                         rate, oxygen saturations, blood pressure, adequacy of                         pulmonary ventilation, and response to care were                         monitored throughout the procedure. The physical                         status of the patient was re-assessed after the                         procedure.                        After obtaining informed consent, the colonoscope was                         passed under direct vision. Throughout the procedure,                         the patient's blood pressure, pulse, and oxygen  saturations were monitored continuously. The                         Colonoscope was introduced through the anus and                         advanced to the the terminal ileum. The colonoscopy                         was performed without difficulty. The patient                         tolerated the procedure well. The quality of the bowel                         preparation was good. The terminal ileum, ileocecal                         valve, appendiceal orifice, and rectum were                         photographed. Findings:      The perianal and digital rectal examinations were normal.      The terminal ileum appeared normal.      Multiple small-mouthed  diverticula were found in the sigmoid colon.      A 10 mm polyp was found in the sigmoid colon. The polyp was       pedunculated. The polyp was removed with a hot snare. Resection and       retrieval were complete. To prevent bleeding post-intervention, two       hemostatic clips were successfully placed. There was no bleeding during,       or at the end, of the procedure.      Internal hemorrhoids were found during retroflexion. The hemorrhoids       were Grade I (internal hemorrhoids that do not prolapse).      The exam was otherwise without abnormality on direct and retroflexion       views. Impression:            - The examined portion of the ileum was normal.                        - Diverticulosis in the sigmoid colon.                        - One 10 mm polyp in the sigmoid colon, removed with a                         hot snare. Resected and retrieved. Clips were placed.                        - Internal hemorrhoids.                        - The examination was otherwise normal on direct and                         retroflexion views. Recommendation:        - Discharge patient to home.                        -  Resume previous diet.                        - Continue present medications.                        - Await pathology results.                        - Repeat colonoscopy for surveillance based on                         pathology results.                        - Return to referring physician as previously                         scheduled. Procedure Code(s):     --- Professional ---                        [phone], Colonoscopy, flexible; with removal of                         tumor(s), polyp(s), or other lesion(s) by snare                         technique Diagnosis Code(s):     --- Professional ---                        Z80.0, Family history of malignant neoplasm of                         digestive organs                        K64.0, First degree hemorrhoids                         D12.5, Benign neoplasm of sigmoid colon                        K57.30, Diverticulosis of large intestine without                         perforation or abscess without bleeding CPT copyright 2022 [employer]. All rights reserved. The codes documented in this report are preliminary and upon coder review may  be revised to meet current compliance requirements. Andrey Farmer MD, MD 06/20/2022 12:39:23 PM Number of Addenda: 0 Note Initiated On: 06/20/2022 11:14 AM Scope Withdrawal Time: 0 hours 15 minutes 51 seconds  Total Procedure Duration: 0 hours 19 minutes 14 seconds  Estimated Blood Loss:  Estimated blood loss: none.      [facility]

## 2022-06-20 NOTE — Interval H&P Note (Signed)
History and Physical Interval Note:  06/20/2022 11:47 AM  Justin Cabrera  has presented today for surgery, with the diagnosis of family history colon cancer/colon polyps mother.  The various methods of treatment have been discussed with the patient and family. After consideration of risks, benefits and other options for treatment, the patient has consented to  Procedure(s): COLONOSCOPY WITH PROPOFOL (N/A) as a surgical intervention.  The patient's history has been reviewed, patient examined, no change in status, stable for surgery.  I have reviewed the patient's chart and labs.  Questions were answered to the patient's satisfaction.     Justin Cabrera  Ok to proceed with colonoscopy

## 2022-06-21 ENCOUNTER — Encounter: Payer: Self-pay | Admitting: Gastroenterology

## 2022-06-21 LAB — SURGICAL PATHOLOGY

## 2023-12-18 ENCOUNTER — Emergency Department

## 2023-12-18 ENCOUNTER — Other Ambulatory Visit: Payer: Self-pay

## 2023-12-18 ENCOUNTER — Emergency Department: Admission: EM | Admit: 2023-12-18 | Discharge: 2023-12-18 | Disposition: A | Discharge status: Home / Self Care

## 2023-12-18 DIAGNOSIS — R0602 Shortness of breath: Secondary | ICD-10-CM | POA: Insufficient documentation

## 2023-12-18 DIAGNOSIS — F17200 Nicotine dependence, unspecified, uncomplicated: Secondary | ICD-10-CM | POA: Insufficient documentation

## 2023-12-18 DIAGNOSIS — M7989 Other specified soft tissue disorders: Secondary | ICD-10-CM | POA: Diagnosis present

## 2023-12-18 DIAGNOSIS — R600 Localized edema: Secondary | ICD-10-CM | POA: Insufficient documentation

## 2023-12-18 DIAGNOSIS — R0789 Other chest pain: Secondary | ICD-10-CM | POA: Diagnosis not present

## 2023-12-18 LAB — CBC
HCT: 40.9 % (ref 39.0–52.0)
Hemoglobin: 14 g/dL (ref 13.0–17.0)
MCH: 35.2 pg — ABNORMAL HIGH (ref 26.0–34.0)
MCHC: 34.2 g/dL (ref 30.0–36.0)
MCV: 102.8 fL — ABNORMAL HIGH (ref 80.0–100.0)
Platelets: 203 K/uL (ref 150–400)
RBC: 3.98 MIL/uL — ABNORMAL LOW (ref 4.22–5.81)
RDW: 13 % (ref 11.5–15.5)
WBC: 9.4 K/uL (ref 4.0–10.5)
nRBC: 0 % (ref 0.0–0.2)

## 2023-12-18 LAB — URINALYSIS, COMPLETE (UACMP) WITH MICROSCOPIC
Bacteria, UA: NONE SEEN
Bilirubin Urine: NEGATIVE
Glucose, UA: NEGATIVE mg/dL
Hgb urine dipstick: NEGATIVE
Ketones, ur: NEGATIVE mg/dL
Leukocytes,Ua: NEGATIVE
Nitrite: NEGATIVE
Protein, ur: NEGATIVE mg/dL
Specific Gravity, Urine: 1.017 (ref 1.005–1.030)
pH: 8 (ref 5.0–8.0)

## 2023-12-18 LAB — HEPATIC FUNCTION PANEL
ALT: 51 U/L — ABNORMAL HIGH (ref 0–44)
AST: 70 U/L — ABNORMAL HIGH (ref 15–41)
Albumin: 3.8 g/dL (ref 3.5–5.0)
Alkaline Phosphatase: 54 U/L (ref 38–126)
Bilirubin, Direct: 0.2 mg/dL (ref 0.0–0.2)
Indirect Bilirubin: 0.9 mg/dL (ref 0.3–0.9)
Total Bilirubin: 1.1 mg/dL (ref 0.0–1.2)
Total Protein: 7.1 g/dL (ref 6.5–8.1)

## 2023-12-18 LAB — BASIC METABOLIC PANEL WITH GFR
Anion gap: 10 (ref 5–15)
BUN: 11 mg/dL (ref 6–20)
CO2: 27 mmol/L (ref 22–32)
Calcium: 8.9 mg/dL (ref 8.9–10.3)
Chloride: 101 mmol/L (ref 98–111)
Creatinine, Ser: 0.87 mg/dL (ref 0.61–1.24)
GFR, Estimated: >60 mL/min (ref >60)
Glucose, Bld: 107 mg/dL — ABNORMAL HIGH (ref 70–99)
Potassium: 4.1 mmol/L (ref 3.5–5.1)
Sodium: 138 mmol/L (ref 135–145)

## 2023-12-18 LAB — LIPASE, BLOOD: Lipase: 29 U/L (ref 11–51)

## 2023-12-18 LAB — BRAIN NATRIURETIC PEPTIDE: B Natriuretic Peptide: 112 pg/mL — ABNORMAL HIGH (ref 0.0–100.0)

## 2023-12-18 LAB — TROPONIN I (HIGH SENSITIVITY): Troponin I (High Sensitivity): 17 ng/L (ref <18)

## 2023-12-18 MED ORDER — FUROSEMIDE 20 MG PO TABS: 20.0000 mg | ORAL_TABLET | Freq: Every day | ORAL | 0 refills | Status: AC

## 2023-12-18 NOTE — ED Triage Notes (Signed)
 Pt to ED via POV from home. Pt reports SOB, chest tightness and retaining fluid x2wks. Pt reports feels like 20lbs of fluid is being retained. Pt reports bilateral ankle swelling. No hx of CHF. Pt reports feels like this occurred after doing a water detox.

## 2023-12-18 NOTE — ED Provider Notes (Signed)
 [facility] Provider Note    Event Date/Time   First MD Initiated Contact with Patient 12/18/23 1549     (approximate)   History   Shortness of Breath (/)  Pt to ED via POV from home. Pt reports SOB, chest tightness and retaining fluid x2wks. Pt reports feels like 20lbs of fluid is being retained. Pt reports bilateral ankle swelling. No hx of CHF. Pt reports feels like this occurred after doing a water detox.    HPI Justin Cabrera is a 48 y.o. male PMH GERD, hyperlipidemia, OSA, tobacco use, alcohol use disorder presents for evaluation of chest tightness, weight gain, ankle swelling - Patient states he believes he has put on 20-30 pounds in the past 2-3 weeks - Feels somewhat short of breath when lying flat.  Has noted bilateral ankle and lower leg swelling. - No chest pain.  Some mild dyspnea on exertion. - No known history of heart failure  Tells me he normally weighs about 240 pounds.  Last checked about 2-3 weeks ago.      Physical Exam   Triage Vital Signs: ED Triage Vitals [12/18/23 1319]  Encounter Vitals Group     BP (!) 160/122     Girls Systolic BP Percentile      Girls Diastolic BP Percentile      Boys Systolic BP Percentile      Boys Diastolic BP Percentile      Pulse Rate 88     Resp 18     Temp 98.3 F (36.8 C)     Temp Source Oral     SpO2 100 %     Weight      Height      Head Circumference      Peak Flow      Pain Score 1     Pain Loc      Pain Education      Exclude from Growth Chart     Most recent vital signs: Vitals:   12/18/23 1730 12/18/23 1800  BP: (!) 156/104 (!) 163/94  Pulse: 86 (!) 52  Resp:  18  Temp:    SpO2: 97% 100%     General: Awake, no distress.  CV:  Good peripheral perfusion. RRR, RP 2+.  Mild bilateral lower extremity edema noted. Resp:  Normal effort. CTAB Abd:  No distention. Nontender to deep palpation throughout   ED Results / Procedures / Treatments   Labs (all labs ordered are  listed, but only abnormal results are displayed) Labs Reviewed  BASIC METABOLIC PANEL WITH GFR - Abnormal; Notable for the following components:      Result Value   Glucose, Bld 107 (*)    All other components within normal limits  CBC - Abnormal; Notable for the following components:   RBC 3.98 (*)    MCV 102.8 (*)    MCH 35.2 (*)    All other components within normal limits  BRAIN NATRIURETIC PEPTIDE - Abnormal; Notable for the following components:   B Natriuretic Peptide 112.0 (*)    All other components within normal limits  HEPATIC FUNCTION PANEL - Abnormal; Notable for the following components:   AST 70 (*)    ALT 51 (*)    All other components within normal limits  URINALYSIS, COMPLETE (UACMP) WITH MICROSCOPIC - Abnormal; Notable for the following components:   Color, Urine YELLOW (*)    APPearance CLEAR (*)    All other components within normal limits  LIPASE,  BLOOD  TROPONIN I (HIGH SENSITIVITY)     EKG  Ecg = sinus rhythm, rate 82, no gross ST elevation or depression, no significant repolarization abnormality, normal axis, normal intervals.  No evidence of ischemia no arrhythmia on my interpretation.   RADIOLOGY Radiology interpreted by myself and radiology report reviewed.  No acute pathology identified.    PROCEDURES:  Critical Care performed: No  Procedures   MEDICATIONS ORDERED IN ED: Medications - No data to display   IMPRESSION / MDM / ASSESSMENT AND PLAN / ED COURSE  I reviewed the triage vital signs and the nursing notes.                              DDX/MDM/AP: Differential diagnosis includes, but is not limited to, early CHF, consider cirrhosis, consider nephrotic syndrome.  Patient overall very well-appearing, satting well on room air.  Asymptomatic currently.  Plan: - Labs - Chest x-ray - EKG - Anticipate will need diuretics  Patient's presentation is most consistent with acute presentation with potential threat to life or bodily  function.   ED course below.  Workup with mildly elevated BNP, otherwise unremarkable.  No evidence of pulmonary edema.  Blood pressure is notably improved on repeat checks without intervention.  Patient does indeed have notable weight gain within the past several weeks, consider possible early heart failure though no evidence of decompensation at this time.  Will start on short course of low-dose diuretics for the next 5 days (20 mg Lasix  daily) and placed referral to follow-up with cardiology.  No evidence of renal failure or cirrhosis at this time.  Also recommend compression stockings.  Counseled on fluid restriction of about 1.5 L daily.  Also plan for PMD follow-up.  ED return precautions placed.  Patient agrees with plan.  Clinical Course as of 12/18/23 2341  Mon Dec 18, 2023  1606 BMP reviewed, unremarkable   [MM]  1606 CBC reviewed, unremarkable beyond mild microcytosis [MM]  1607 BNP mildly elevated Troponin normal [MM]  1607 Chest x-ray interpreted by myself and radiology report reviewed, no acute pathology on my interpretation, radiology report below  IMPRESSION: No evidence of active cardiopulmonary process.   [MM]  1740 LFTs reviewed, very mild AST and ALT elevation, bilirubin normal.  Overall not highly suggestive of cirrhosis. [MM]  1740 Urinalysis with no significant proteinuria. [MM]    Clinical Course User Index [MM] Clarine Ozell LABOR, MD     FINAL CLINICAL IMPRESSION(S) / ED DIAGNOSES   Final diagnoses:  Bilateral leg edema     Rx / DC Orders   ED Discharge Orders          Ordered    furosemide  (LASIX ) 20 MG tablet  Daily        12/18/23 1756    Ambulatory referral to Cardiology       Comments: Possible new onset heart failure   12/18/23 1756             Note:  This document was prepared using Dragon voice recognition software and may include unintentional dictation errors.   Clarine Ozell LABOR, MD 12/18/23 [phone]

## 2023-12-18 NOTE — Discharge Instructions (Addendum)
 Your evaluation in the emergency department is overall reassuring.  You do seem to be retaining fluid, and I have started you on a pill to help with this over the next 5 days.  I also placed a referral for you to follow-up with a cardiologist to further evaluate your heart as a possible cause of this.  Please do follow-up with your primary care provider as well.  Return to the emergency department with any new or worsening symptoms.

## 2024-01-08 ENCOUNTER — Ambulatory Visit: Admitting: Medical

## 2024-01-08 NOTE — Progress Notes (Deleted)
  Cardiology Office Note   Date:  01/08/2024  ID:  Demarcus Thielke, DOB 1976/02/17, MRN [id number] PCP: Watt Mirza, MD  [facility] Providers Cardiologist:  None { Click to update primary MD,subspecialty MD or APP then REFRESH:1}    History of Present Illness Vedder Brittian is a 48 y.o. male with a h/o GERD, HLD, OSA, tobacco use, alcohol use disorder who presents for chest pain, SOB and swelling.   He was seen in the ER 12/18/23 reporting shortness of breath, chest tightness, fluid retention for 2 weeks.  He felt he was 20 pounds overloaded.  He reported lower leg edema.  Blood pressure was 160/122.  Labs showed BNP of 112, mildly elevated AST/ALT.  High-sensitivity troponin was normal.  Chest x-ray was nonacute.  EKG showed normal sinus rhythm with no significant ischemia.  Patient was started on Lasix  20 mg daily and referred to cardiology.  ROS: ***  Studies Reviewed      *** Risk Assessment/Calculations {Does this patient have ATRIAL FIBRILLATION?:[phone]} No BP recorded.  {Refresh Note OR Click here to enter BP  :1}***       Physical Exam VS:  There were no vitals taken for this visit.       Wt Readings from Last 3 Encounters:  12/18/23 266 lb 12.8 oz (121 kg)  06/20/22 246 lb 12.8 oz (111.9 kg)  09/21/21 212 lb 1.3 oz (96.2 kg)    GEN: Well nourished, well developed in no acute distress NECK: No JVD; No carotid bruits CARDIAC: ***RRR, no murmurs, rubs, gallops RESPIRATORY:  Clear to auscultation without rales, wheezing or rhonchi  ABDOMEN: Soft, non-tender, non-distended EXTREMITIES:  No edema; No deformity   ASSESSMENT AND PLAN ***    {Are you ordering a CV Procedure (e.g. stress test, cath, DCCV, TEE, etc)?   Press F2        :[id number]}  Dispo: ***  Signed, Forest Redwine VEAR Fishman, PA-C

## 2024-01-16 NOTE — Progress Notes (Unsigned)
  Cardiology Office Note   Date:  01/16/2024  ID:  Justin Cabrera, DOB [birth year], MRN [id number] PCP: Watt Mirza, MD  [facility] Providers Cardiologist:  None { Click to update primary MD,subspecialty MD or APP then REFRESH:1}    History of Present Illness Justin Cabrera is a 48 y.o. male PMH HLD, obesity, EtOH use who presents for further evaluation management of lower extremity edema.  Patient was recently seen in the ED 12/18/2023 for bilateral lower extremity edema and weight gain.  A BNP was 112.  LFTs mildly elevated.  Troponin negative.  Last LDL 208 07/2021.  Relevant CVD History -Normal ETT 01/2013   ROS: Pt denies any chest discomfort, jaw pain, arm pain, palpitations, syncope, presyncope, orthopnea, PND, or LE edema.  Studies Reviewed I have independently reviewed the patient's ECG, previous cardiac testing, recent blood work, recent medical records.  Physical Exam VS:  There were no vitals taken for this visit.       Wt Readings from Last 3 Encounters:  12/18/23 266 lb 12.8 oz (121 kg)  06/20/22 246 lb 12.8 oz (111.9 kg)  09/21/21 212 lb 1.3 oz (96.2 kg)    GEN: No acute distress. NECK: No JVD; No carotid bruits. CARDIAC: ***RRR, no murmurs, rubs, gallops. RESPIRATORY:  Clear to auscultation. EXTREMITIES:  Warm and well-perfused. No edema.  ASSESSMENT AND PLAN Anasarca Elevated BNP HLD        {Are you ordering a CV Procedure (e.g. stress test, cath, DCCV, TEE, etc)?   Press F2        :[id number]}  Dispo: ***  Signed, Caron Poser, MD

## 2024-01-18 ENCOUNTER — Ambulatory Visit

## 2024-01-25 ENCOUNTER — Ambulatory Visit: Admitting: Cardiology

## 2024-02-12 ENCOUNTER — Ambulatory Visit

## 2024-02-16 NOTE — Progress Notes (Signed)
 0- Cardiology Office Note   Date:  02/20/2024  ID:  Justin Cabrera, DOB 12/10/1975, MRN [id number] PCP: Watt Mirza, MD  [facility] Providers Cardiologist:  Caron Poser, MD     History of Present Illness Justin Cabrera is a 48 y.o. male PMH HLD, obesity, EtOH use, OSA on CPAP who presents for further evaluation management of lower extremity edema.  Patient was recently seen in the ED 12/18/2023 for bilateral lower extremity edema and weight gain.  A BNP was 112.  LFTs mildly elevated.  Albumin normal.  No albuminuria on UA. Troponin negative.  Last LDL 208 07/2021.  Patient reports a several year history of chronic fatigue.  He also notes that he has been retaining fluid for the last couple of months.  He is able to lay flat.  He does report a diagnosis of sleep apnea which he uses CPAP for.  Relevant CVD History -Normal ETT 01/2013   ROS: Pt denies any chest discomfort, jaw pain, arm pain, palpitations, syncope, presyncope, orthopnea, PND, or LE edema.  Studies Reviewed I have independently reviewed the patient's ECG, previous cardiac testing, recent blood work, recent medical records.  Physical Exam VS:  BP 116/72 (BP Location: Left Arm, Patient Position: Sitting, Cuff Size: Large)   Pulse (!) 108   Ht 5' 8 (1.727 m)   Wt 259 lb (117.5 kg)   SpO2 98%   BMI 39.38 kg/m        Wt Readings from Last 3 Encounters:  02/20/24 259 lb (117.5 kg)  12/18/23 266 lb 12.8 oz (121 kg)  06/20/22 246 lb 12.8 oz (111.9 kg)    GEN: No acute distress. NECK: Unable to assess given habitus; No carotid bruits. CARDIAC: RRR, no murmurs, rubs, gallops. RESPIRATORY:  Clear to auscultation. EXTREMITIES:  Warm and well-perfused.  Trace edema.  ASSESSMENT AND PLAN Anasarca Elevated BNP DOE Morbid obesity OSA Patient presents with DOE, anasarca, and a mildly elevated BNP which remains undifferentiated.  He does have morbid obesity and sleep apnea, so it is possible that this is all from  right sided heart failure due to OSA/OHS.  However, we do need to investigate this further.  He does not have any rales on exam today.  He does have a concerning family history of sudden cardiac death of his father and ASCVD events in his grandfather.  A UA done in the ED showed no albuminuria, making renal etiology less likely.  His albumin serum is also normal.  LFTs were mildly elevated.  Plan: - Echocardiogram - Liver ultrasound to rule out NASH/cirrhosis; repeat CMP - Coronary CT angiogram given DOE and heart failure to rule out significant coronary disease - Repeat BNP - He would be an ideal candidate for Zepbound; he is a disabled veteran, so I encouraged him to try to go through the [facility] first since they will likely pay for the medication.  If this is not viable, then we will set him up with our pharmacy staff to review options.  HLD LDL cholesterol reported of 208 in 2023.  He had a total cholesterol of 220 06/2023.  We will repeat a lipid panel.  We will not start statin therapy just yet since he has elevated LFTs on his last labs.  If he has persistent liver injury, then we may need to do Repatha or another not hepatotoxic alternative.        Dispo: RTC 2 months or sooner as needed  Signed, Caron Poser, MD

## 2024-02-20 ENCOUNTER — Ambulatory Visit

## 2024-02-20 VITALS — BP 116/72 | HR 108 | Ht 68.0 in | Wt 259.0 lb

## 2024-02-20 DIAGNOSIS — R0609 Other forms of dyspnea: Secondary | ICD-10-CM

## 2024-02-20 DIAGNOSIS — R7989 Other specified abnormal findings of blood chemistry: Secondary | ICD-10-CM | POA: Diagnosis not present

## 2024-02-20 DIAGNOSIS — E782 Mixed hyperlipidemia: Secondary | ICD-10-CM | POA: Diagnosis not present

## 2024-02-20 DIAGNOSIS — R072 Precordial pain: Secondary | ICD-10-CM | POA: Diagnosis not present

## 2024-02-20 DIAGNOSIS — R601 Generalized edema: Secondary | ICD-10-CM | POA: Diagnosis not present

## 2024-02-20 DIAGNOSIS — Z79899 Other long term (current) drug therapy: Secondary | ICD-10-CM

## 2024-02-20 MED ORDER — METOPROLOL TARTRATE 100 MG PO TABS: 100.0000 mg | ORAL_TABLET | Freq: Once | ORAL | 0 refills | Status: AC

## 2024-02-20 NOTE — Patient Instructions (Addendum)
 Medication Instructions:  Your physician recommends that you continue on your current medications as directed. Please refer to the Current Medication list given to you today.    [facility] Z[facility]BOUND and SLEEP APNEA STUDY  *If you need a refill on your cardiac medications before your next appointment, please call your pharmacy*  Lab Work: Your provider would like for you to have following labs drawn today CMP, BNP, LIPID PANEL.   If you have labs (blood work) drawn today and your tests are completely normal, you will receive your results only by: MyChart Message (if you have MyChart) OR A paper copy in the mail If you have any lab test that is abnormal or we need to change your treatment, we will call you to review the results.  Testing/Procedures: Your physician has requested that you have an echocardiogram. Echocardiography is a painless test that uses sound waves to create images of your heart. It provides your doctor with information about the size and shape of your heart and how well your heart's chambers and valves are working.   You may receive an ultrasound enhancing agent through an IV if needed to better visualize your heart during the echo. This procedure takes approximately one hour.  There are no restrictions for this procedure.  This will take place at [address] [facility] [address]) [address], [place] [address]  Please note: We ask at that you not bring children with you during ultrasound (echo/ vascular) testing. Due to room size and safety concerns, children are not allowed in the ultrasound rooms during exams. Our front office staff cannot provide observation of children in our lobby area while testing is being conducted. An adult accompanying a patient to their appointment will only be allowed in the ultrasound room at the discretion of the ultrasound technician under special circumstances. We apologize for any inconvenience.    Liver  Ultrasound   November 18 @ 8:45 AM  Your physician has requested that you have a Liver Ultrasound. During this test, an ultrasound is used to evaluate blood flow to the kidneys. Take your medications as you usually do.  [facility]  [address], [place], [place]    No food after 11PM the night before.  Water is OK. (Don't drink liquids if you have been instructed not to for ANOTHER test). Avoid foods that produce bowel gas, for 24 hours prior to exam (see below). No breakfast, no chewing gum, no smoking or carbonated beverages. Patient may take morning medications with water.  Hold Lasix  until after the procedure Come in for test at least 30 minutes early to register.  Please note: We ask at that you not bring children with you during ultrasound (echo/ vascular) testing. Due to room size and safety concerns, children are not allowed in the ultrasound rooms during exams. Our front office staff cannot provide observation of children in our lobby area while testing is being conducted. An adult accompanying a patient to their appointment will only be allowed in the ultrasound room at the discretion of the ultrasound technician under special circumstances. We apologize for any inconvenience.     Your cardiac CT will be scheduled at one of the below locations:   [facility] [address] [place], [place] [address] [phone]  Please arrive 15 mins early for check-in and test prep.  There is spacious parking Copy Available) and easy access to the radiology department from the [facility]  entrance. Please enter here and check-in with the desk attendant.   Please follow these instructions carefully (unless otherwise directed):  An IV will be required for this test and Nitroglycerin will be given.  Hold all erectile dysfunction medications at least 3 days (72 hrs) prior to test. (Ie viagra, cialis, sildenafil, tadalafil, etc)   On the Night  Before the Test: Be sure to Drink plenty of water. Do not consume any caffeinated/decaffeinated beverages or chocolate 12 hours prior to your test. Do not take any antihistamines 12 hours prior to your test.  On the Day of the Test: Drink plenty of water until 1 hour prior to the test. Do not eat any food 1 hour prior to test. You may take your regular medications prior to the test.  Take metoprolol (Lopressor) 100 MG two hours prior to test.  Please HOLD LASIX  on the morning of the test.      After the Test: Drink plenty of water. After receiving IV contrast, you may experience a mild flushed feeling. This is normal. On occasion, you may experience a mild rash up to 24 hours after the test. This is not dangerous. If this occurs, you can take Benadryl 25 mg, Zyrtec, Claritin, or Allegra and increase your fluid intake. (Patients taking Tikosyn should avoid Benadryl, and may take Zyrtec, Claritin, or Allegra) If you experience trouble breathing, this can be serious. If it is severe call 911 IMMEDIATELY. If it is mild, please call our office.  We will call to schedule your test 2-4 weeks out understanding that some insurance companies will need an authorization prior to the service being performed.   For more information and frequently asked questions, please visit our website : [email or web address]  For non-scheduling related questions, please contact the cardiac imaging nurse navigator should you have any questions/concerns: Cardiac Imaging Nurse Navigators Direct Office Dial: [phone]   For scheduling needs, including cancellations and rescheduling, please call Brittany, [phone].      Follow-Up: At [facility], you and your health needs are our priority.  As part of our continuing mission to provide you with exceptional heart care, our providers are all part of one team.  This team includes your primary Cardiologist (physician) and Advanced Practice  Providers or APPs (Physician Assistants and Nurse Practitioners) who all work together to provide you with the care you need, when you need it.  Your next appointment:   2 month(s)  Provider:   Caron Poser, MD    We recommend signing up for the patient portal called MyChart.  Sign up information is provided on this After Visit Summary.  MyChart is used to connect with patients for Virtual Visits (Telemedicine).  Patients are able to view lab/test results, encounter notes, upcoming appointments, etc.  Non-urgent messages can be sent to your provider as well.   To learn more about what you can do with MyChart, go to [email or web address].

## 2024-02-27 ENCOUNTER — Other Ambulatory Visit: Admission: RE | Admit: 2024-02-27 | Discharge: 2024-02-27 | Disposition: A | Source: Ambulatory Visit

## 2024-02-27 ENCOUNTER — Ambulatory Visit
Admission: RE | Admit: 2024-02-27 | Discharge: 2024-02-27 | Disposition: A | Discharge status: Home / Self Care | Source: Ambulatory Visit

## 2024-02-27 DIAGNOSIS — R0609 Other forms of dyspnea: Secondary | ICD-10-CM | POA: Diagnosis present

## 2024-02-27 DIAGNOSIS — R7989 Other specified abnormal findings of blood chemistry: Secondary | ICD-10-CM | POA: Insufficient documentation

## 2024-02-27 DIAGNOSIS — R601 Generalized edema: Secondary | ICD-10-CM | POA: Insufficient documentation

## 2024-02-27 LAB — LIPID PANEL
Cholesterol: 249 mg/dL — ABNORMAL HIGH (ref 0–200)
HDL: 49 mg/dL (ref >40)
LDL Cholesterol: 175 mg/dL — ABNORMAL HIGH (ref 0–99)
Total CHOL/HDL Ratio: 5.1 ratio
Triglycerides: 122 mg/dL (ref <150)
VLDL: 24 mg/dL (ref 0–40)

## 2024-02-27 LAB — COMPREHENSIVE METABOLIC PANEL WITH GFR
ALT: 41 U/L (ref 0–44)
AST: 55 U/L — ABNORMAL HIGH (ref 15–41)
Albumin: 4.3 g/dL (ref 3.5–5.0)
Alkaline Phosphatase: 54 U/L (ref 38–126)
Anion gap: 10 (ref 5–15)
BUN: 14 mg/dL (ref 6–20)
CO2: 24 mmol/L (ref 22–32)
Calcium: 9.6 mg/dL (ref 8.9–10.3)
Chloride: 102 mmol/L (ref 98–111)
Creatinine, Ser: 0.91 mg/dL (ref 0.61–1.24)
GFR, Estimated: >60 mL/min (ref >60)
Glucose, Bld: 114 mg/dL — ABNORMAL HIGH (ref 70–99)
Potassium: 4.3 mmol/L (ref 3.5–5.1)
Sodium: 137 mmol/L (ref 135–145)
Total Bilirubin: 0.8 mg/dL (ref 0.0–1.2)
Total Protein: 6.8 g/dL (ref 6.5–8.1)

## 2024-02-27 LAB — PRO BRAIN NATRIURETIC PEPTIDE: Pro Brain Natriuretic Peptide: 51.1 pg/mL (ref <300.0)

## 2024-02-28 ENCOUNTER — Telehealth: Payer: Self-pay

## 2024-02-28 NOTE — Telephone Encounter (Signed)
 Pt called in stating he is returning Dr. Martine call about test results

## 2024-02-29 NOTE — Telephone Encounter (Signed)
 Left a message for the patient to call back.

## 2024-03-04 ENCOUNTER — Ambulatory Visit: Payer: Self-pay

## 2024-03-04 MED ORDER — ROSUVASTATIN CALCIUM 5 MG PO TABS: 5.0000 mg | ORAL_TABLET | Freq: Every day | ORAL | 3 refills | Status: AC

## 2024-03-04 NOTE — Telephone Encounter (Signed)
 Spoke with patient regarding recent call. Per chart, patient had spoke with Dr Argentina this morning. Dr Argentina recommends starting crestor  5 mg daily for high cholesterol and stopping the pravastatin. I went ahead and sent the medication to patient pharmacy for him to pick up. Patient verbalized understanding. No further questions at this time.

## 2024-03-13 ENCOUNTER — Telehealth (HOSPITAL_COMMUNITY): Payer: Self-pay | Admitting: Emergency Medicine

## 2024-03-13 NOTE — Telephone Encounter (Signed)
 Reaching out to patient to offer assistance regarding upcoming cardiac imaging study; pt verbalizes understanding of appt date/time, parking situation and where to check in, pre-test NPO status and medications ordered, and verified current allergies; name and call back number provided for further questions should they arise Rockwell Alexandria RN Navigator Cardiac Imaging [facility] [phone] office [phone] cell

## 2024-03-14 ENCOUNTER — Ambulatory Visit: Admission: RE | Admit: 2024-03-14 | Discharge: 2024-03-14 | Disposition: A | Source: Ambulatory Visit

## 2024-03-14 ENCOUNTER — Other Ambulatory Visit: Payer: Self-pay | Admitting: Cardiology

## 2024-03-14 ENCOUNTER — Ambulatory Visit
Admission: RE | Admit: 2024-03-14 | Discharge: 2024-03-14 | Disposition: A | Source: Ambulatory Visit | Attending: Cardiology

## 2024-03-14 DIAGNOSIS — R931 Abnormal findings on diagnostic imaging of heart and coronary circulation: Secondary | ICD-10-CM

## 2024-03-14 DIAGNOSIS — R072 Precordial pain: Secondary | ICD-10-CM | POA: Diagnosis present

## 2024-03-14 DIAGNOSIS — I2510 Atherosclerotic heart disease of native coronary artery without angina pectoris: Secondary | ICD-10-CM | POA: Diagnosis not present

## 2024-03-14 DIAGNOSIS — R0609 Other forms of dyspnea: Secondary | ICD-10-CM | POA: Insufficient documentation

## 2024-03-14 MED ORDER — METOPROLOL TARTRATE 5 MG/5ML IV SOLN: 10.0000 mg | Freq: Once | INTRAVENOUS | Status: DC | PRN

## 2024-03-14 MED ORDER — NITROGLYCERIN 0.4 MG SL SUBL
0.8000 mg | SUBLINGUAL_TABLET | Freq: Once | SUBLINGUAL | Status: AC
  Administered 2024-03-14: 0.8 mg via SUBLINGUAL

## 2024-03-14 MED ORDER — DILTIAZEM HCL 25 MG/5ML IV SOLN: 10.0000 mg | INTRAVENOUS | Status: DC | PRN

## 2024-03-14 MED ORDER — IOHEXOL 350 MG/ML SOLN
100.0000 mL | Freq: Once | INTRAVENOUS | Status: AC | PRN
  Administered 2024-03-14: 100 mL via INTRAVENOUS

## 2024-03-14 NOTE — Progress Notes (Signed)
 Patient tolerated procedure well. W/C to lobby.  Ambulate w/o difficulty. Denies light headedness or being dizzy. Encouraged to drink extra water today and reasoning explained. Verbalized understanding. All questions answered. ABC intact. No further needs. Discharge from procedure area w/o issues.

## 2024-04-15 ENCOUNTER — Ambulatory Visit

## 2024-04-15 DIAGNOSIS — R7989 Other specified abnormal findings of blood chemistry: Secondary | ICD-10-CM

## 2024-04-15 DIAGNOSIS — R0609 Other forms of dyspnea: Secondary | ICD-10-CM | POA: Diagnosis not present

## 2024-04-15 DIAGNOSIS — R601 Generalized edema: Secondary | ICD-10-CM | POA: Diagnosis not present

## 2024-04-15 LAB — ECHOCARDIOGRAM COMPLETE
AR max vel: 2.37 cm2
AV Area VTI: 2.49 cm2
AV Area mean vel: 2.36 cm2
AV Mean grad: 5 mmHg
AV Peak grad: 8.3 mmHg
Ao pk vel: 1.44 m/s
Area-P 1/2: 2.87 cm2
S' Lateral: 3.1 cm

## 2024-04-22 ENCOUNTER — Ambulatory Visit

## 2024-04-22 VITALS — BP 128/90 | HR 77 | Ht 68.0 in | Wt 272.0 lb

## 2024-04-22 DIAGNOSIS — R931 Abnormal findings on diagnostic imaging of heart and coronary circulation: Secondary | ICD-10-CM

## 2024-04-22 DIAGNOSIS — R601 Generalized edema: Secondary | ICD-10-CM

## 2024-04-22 DIAGNOSIS — R7989 Other specified abnormal findings of blood chemistry: Secondary | ICD-10-CM | POA: Diagnosis not present

## 2024-04-22 DIAGNOSIS — R0609 Other forms of dyspnea: Secondary | ICD-10-CM | POA: Diagnosis not present

## 2024-04-22 DIAGNOSIS — E782 Mixed hyperlipidemia: Secondary | ICD-10-CM

## 2024-04-22 MED ORDER — DAPAGLIFLOZIN PROPANEDIOL 10 MG PO TABS: 10.0000 mg | ORAL_TABLET | Freq: Every day | ORAL | 0 refills | Status: AC

## 2024-04-22 NOTE — Progress Notes (Signed)
 0- Cardiology Office Note   Date:  04/22/2024  ID:  Justin Cabrera, DOB 04-May-1975, MRN [id number] PCP: Watt Mirza, MD  [facility] Providers Cardiologist:  Caron Poser, MD     History of Present Illness Justin Cabrera is a 49 y.o. male PMH HLD, obesity, EtOH use, OSA on CPAP who presents for further evaluation management of lower extremity edema.  Patient was recently seen in the ED 12/18/2023 for bilateral lower extremity edema and weight gain.  A BNP was 112.  LFTs mildly elevated.  Albumin normal.  No albuminuria on UA. Troponin negative.  Last LDL 208 07/2021.  Patient reports a several year history of chronic fatigue.  He also notes that he has been retaining fluid for the last couple of months.  He is able to lay flat.  He does report a diagnosis of sleep apnea which he uses CPAP for.  Interval history: Since last visit, we obtained an echocardiogram which was essentially normal.  A coronary CT angiogram showed moderate mid RCA stenosis which was CT FFR negative.  Repeat BNP was normal.  Continues to have dyspnea on exertion.  Relevant CVD History -TTE 04/2024 normal biventricular function, grade 1 diastolic dysfunction, normal RV size and function, no significant valvular disease -CCTA 03/2024 mild LAD stenosis, minimal LCx stenosis, moderate mid RCA stenosis.  CT FFR 0.97 across RCA. -Normal ETT 01/2013   ROS: Pt denies any chest discomfort, jaw pain, arm pain, palpitations, syncope, presyncope, orthopnea, PND, or LE edema.  Studies Reviewed I have independently reviewed the patient's ECG, previous cardiac testing, recent blood work, recent medical records.  Physical Exam VS:  BP (!) 128/90 (BP Location: Left Arm, Patient Position: Sitting, Cuff Size: Normal)   Pulse 77   Ht 5' 8 (1.727 m)   Wt 272 lb (123.4 kg)   SpO2 97%   BMI 41.36 kg/m        Wt Readings from Last 3 Encounters:  04/22/24 272 lb (123.4 kg)  02/20/24 259 lb (117.5 kg)  12/18/23 266 lb 12.8 oz  (121 kg)    GEN: No acute distress. NECK: Unable to assess given habitus; No carotid bruits. CARDIAC: RRR, no murmurs, rubs, gallops. RESPIRATORY:  Clear to auscultation. EXTREMITIES:  Warm and well-perfused.  Trace edema.  ASSESSMENT AND PLAN Anasarca Elevated BNP DOE Morbid obesity OSA Patient presents with DOE, anasarca, and a mildly elevated BNP.  He does have morbid obesity and sleep apnea, so it is possible that this is all from HFpEF and/or right sided heart failure due to OSA/OHS. He does have a concerning family history of sudden cardiac death of his father and ASCVD events in his grandfather.  We obtained an echocardiogram which showed normal biventricular function with grade 1 diastolic dysfunction.  A coronary CT angiogram showed moderate disease of the mid RCA which was CT FFR negative.  However, given his age, this is consistent with accelerated atherosclerosis.  He continues to have exertional dyspnea.  Plan: - Given patient's continued symptoms and evidence of moderate RCA disease on CT, I recommended coronary angiogram and RHC to further clarify whether or not symptoms are due to CAD/anginal equivalent versus HFpEF.  At this time, he would like to hold off and think about this further before pursuing. - Start farxiga  10mg  every day for presumed HFpEF - Lasix  PRN - I think he would benefit significantly from weight loss.  Will refer to our staff pharmacist for consideration of GLP-1 therapies.  Nonobstructive CAD HLD Moderate RCA  disease which was FFR negative based on coronary CTA 03/2024.  Last LDL 175 02/2024.  We started Crestor  5 mg daily at that time.  LDL goal less than 70 given CAD on coronary CTA.  Plan: - Continue ASA 81 mg daily - Continue Crestor  5 mg daily - Recheck lipids next visit.  He has had problems with statin intolerance with higher doses of crestor /lipitor. I briefly discussed statin alternatives with him. We settled on referral to our staff  pharmacist for consideration of statin alternatives.        Dispo: RTC 3 months or sooner as needed  Signed, Caron Poser, MD

## 2024-04-22 NOTE — Patient Instructions (Signed)
 Medication Instructions:  Start Farxiga  10 mg daily. *If you need a refill on your cardiac medications before your next appointment, please call your pharmacy*  Lab Work: No labs order. If you have labs (blood work) drawn today and your tests are completely normal, you will receive your results only by: MyChart Message (if you have MyChart) OR A paper copy in the mail If you have any lab test that is abnormal or we need to change your treatment, we will call you to review the results.  Testing/Procedures: No test or ordered.  Follow-Up: At [facility], you and your health needs are our priority.  As part of our continuing mission to provide you with exceptional heart care, our providers are all part of one team.  This team includes your primary Cardiologist (physician) and Advanced Practice Providers or APPs (Physician Assistants and Nurse Practitioners) who all work together to provide you with the care you need, when you need it.  Your next appointment:   3 month(s)  Provider:   Caron Poser, MD    We recommend signing up for the patient portal called MyChart.  Sign up information is provided on this After Visit Summary.  MyChart is used to connect with patients for Virtual Visits (Telemedicine).  Patients are able to view lab/test results, encounter notes, upcoming appointments, etc.  Non-urgent messages can be sent to your provider as well.   To learn more about what you can do with MyChart, go to [email or web address].   Other Instructions Referral for Pharm D has been sent.

## 2024-07-22 ENCOUNTER — Ambulatory Visit
